# Patient Record
Sex: Female | Born: 1990 | Race: White | Hispanic: No | Marital: Single
Health system: Southern US, Community
[De-identification: ages and names within clinical notes are randomized; demographics above are authoritative.]

## PROBLEM LIST (undated history)

## (undated) DIAGNOSIS — K219 Gastro-esophageal reflux disease without esophagitis: Secondary | ICD-10-CM

## (undated) DIAGNOSIS — R110 Nausea: Secondary | ICD-10-CM

## (undated) DIAGNOSIS — R630 Anorexia: Secondary | ICD-10-CM

## (undated) DIAGNOSIS — R519 Headache, unspecified: Secondary | ICD-10-CM

## (undated) DIAGNOSIS — R109 Unspecified abdominal pain: Secondary | ICD-10-CM

## (undated) DIAGNOSIS — R195 Other fecal abnormalities: Secondary | ICD-10-CM

## (undated) DIAGNOSIS — F419 Anxiety disorder, unspecified: Secondary | ICD-10-CM

## (undated) HISTORY — PX: CHOLECYSTECTOMY: SHX55

## (undated) HISTORY — DX: Other fecal abnormalities: R19.5

## (undated) HISTORY — DX: Anorexia: R63.0

## (undated) HISTORY — PX: CYST REMOVAL: SHX22

## (undated) HISTORY — DX: Anxiety disorder, unspecified: F41.9

## (undated) HISTORY — PX: TONSILLECTOMY: SHX28A

## (undated) HISTORY — PX: COLONOSCOPY: SHX174

## (undated) HISTORY — DX: Unspecified abdominal pain: R10.9

## (undated) HISTORY — PX: UPPER GASTROINTESTINAL ENDOSCOPY: SHX188

## (undated) HISTORY — DX: Nausea: R11.0

## (undated) HISTORY — DX: Headache, unspecified: R51.9

---
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
## 2012-07-17 ENCOUNTER — Ambulatory Visit: Payer: Self-pay | Admitting: Orthopedic Surgery

## 2012-07-17 ENCOUNTER — Other Ambulatory Visit: Payer: Self-pay | Admitting: Orthopedic Surgery

## 2012-07-17 ENCOUNTER — Encounter: Payer: Self-pay | Admitting: Gastroenterology

## 2012-07-17 ENCOUNTER — Encounter: Payer: Self-pay | Admitting: Orthopedic Surgery

## 2012-07-17 VITALS — BP 114/62 | HR 56 | Ht 66.0 in | Wt 110.0 lb

## 2012-07-17 NOTE — Progress Notes (Signed)
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 She was at a concert, she jammed her finger/caught her finger in another person's clothing, she had pain, swelling, and deformity. She had x-rays at an urgent care which showed a fracture, she was placed in a splint which is uncomfortable and she removed, she put a new splint on herself. She has no paresthesias, no previous injuries, no other complaints.    PAST MEDICAL HISTORY: Negative    PAST SURGICAL HISTORY: Tonsillectomy, neck mass excision    MEDICATIONS:  See Medication List    ALLERGIES:  See Allergy List    PERSONAL HISTORY: Single    SOCIAL HISTORY: She does smoke and drink alcohol    FAMILY HISTORY: Family is healthy    REVIEW OF SYSTEMS:  Review of Gastointestinal, Genitourinary, Neurologic, Integument, Vascular, Hematologic, Lymphatic, Cardiac, Pulmonary and Endocrine systems reveal the following: The review of systems has been reviewed and is available on the scanned intake form. Positive responses are noted if pertinent.    PHYSICAL EXAM: The ring finger shows fusiform soft tissue swelling, ecchymosis, tenderness, and questionable slight deformity at the mid phalanx. Collateral ligaments grossly intact, limited range of motion at the PIP and DIP joint. No gross rotational deformity.    IMAGING: An oblique spiral fracture of the mid phalanx with mild shortening and displacement            ASSESSMENT AND DIAGNOSIS: Mildly displaced and shortened fracture of the mid phalanx.    PLAN: I reviewed his case with Dr. Shawna Clamp who examined the patient and discussed treatment options with the patient, together they decided that surgical intervention is most appropriate. Patient is placed in a volar/dorsal splint in intrinsic plus position, followup as per Dr. Roxy Manns office. She is taken out of work on  total disability.    ORDERS TODAY:    ORDERS PLACED FOR NEXT VISIT:    PERCENT OF TEMPORARY IMPAIRMENT:      The above document was generated using voice recognition software. Reasonable attempts at correction were made. Please excuse any unintended transcription errors.

## 2012-07-18 ENCOUNTER — Telehealth: Payer: Self-pay

## 2012-07-18 NOTE — H&P (Signed)
PATIENTDELMIRA, GRIFFITHS  MR #:  [id number]   ACCOUNT #:  [id number] DOB:  1990-11-10   ATTENDING:  Cam Hai, MD AGE:  22   DATE OF VISIT:  07/17/2012     PLEASE ALL SEE URGENT CARE CLINIC NOTE FOR THIS DATE BY Flonnie Hailstone.     The patient is seen today after a finger fracture.  She has an oblique spiral fracture of the P2 bone of the right ring finger.  It is her dominant hand.       We have discussed the nature of surgery, the risks and benefits, and the need to take out pins.  She is going to undergo surgery.  We are going to call her to begin the scheduling process.             ______________________________  Cam Hai, MD    JE/MODL  DD:  07/18/2012 07:50:06  DT:  07/18/2012 12:29:16  Job #:  [id number]    cc: Cam Hai, MD   Bevelyn Buckles, MD   [address]   [place], [place] [address]

## 2012-07-19 ENCOUNTER — Encounter: Payer: Self-pay | Admitting: Orthopedic Surgery

## 2012-07-19 NOTE — OR PreOp (Signed)
[facility] Pre-operative Guidelines    Day of surgery arrival Time:  The surgery center will call you the business day before your procedure between 2 and 4:30pm.     Eating and drinking guidelines:  No solid foods after midnight the night before your procedure.  Adults-  May have clear liquids (water, soda or apple juice only) up to 4 hours prior to your arrival time and then nothing by mouth.  Pediatrics-  All but dental patients may have clear liquids (water, soda, or apple juice only) up to 3 hours before arrival time.   Dental patients are NPO after midnight.  Breast fed infants may have breast milk up to 4 hours before arrival.  Formula fed infants may have formula up to 6 hours before arrival.    Medications:  Medications to be taken on the day of surgery:  none    Items to bring on the day of surgery:  Insurance card, photo ID and healthcare proxy.  If you use a CPAP for sleep apnea, please bring your mask with you.  Your crutches, knee brace or arm sling. Please leave your crutches in the car.    Clothing, Jewelry, valuables and eyeglass case:  Wear loose fitting clothing that will fit over a bulky dressing.  Leave all jewelry and valuables at home.  Bring your eyeglasses and eyeglass case. You cannot wear contact lenses.     Pediatrics:  A parent or legal guardian must accompany and remain in the building at all times. We recommend that two adults accompany the patient home while riding in a car; one to drive the vehicle and one to assist with the care of the child.   Bring the child’s favorite comfort item (i.e. blanket, doll or toy).    Surgeon Instructions:  Please read all pre-operative instructions carefully, and follow your surgeons guidelines if different from these instructions.

## 2012-07-19 NOTE — Anesthesia Pre-procedure Eval (Signed)
Anesthesia Pre-operative Evaluation for [facility] History  History reviewed. No pertinent past medical history.  Past Surgical History   Procedure Laterality Date   . Tonsillectomy     . Cyst removal       Social History  History   Substance Use Topics   . Smoking status: Current Every Day Smoker -- 1.00 packs/day for 4 years   . Smokeless tobacco: Never Used   . Alcohol Use: Yes      Comment: occasional      History   Drug Use No     ______________________________________________________________________  Allergies: No Known Allergies (drug, envir, food or latex)  Prior to Admission Medications              Last Dose Start Date End Date Provider     ibuprofen (ADVIL,MOTRIN) 600 MG tablet   --  --  [provider]        Current Outpatient Prescriptions   Medication Sig   . ibuprofen (ADVIL,MOTRIN) 600 MG tablet Take 600 mg by mouth 3 times daily as needed for Pain     No current facility-administered medications for this encounter.     Admission Medications:  Scheduled MedsIV MedsPRN Meds  Anesthesia EvaluationInformation Source: per patient, per family, per records  General  Pertinent (-):  history of anesthetic complications or FamHx of anesthetic complications        Pulmonary     + Smoker            currently  Pertinent (-):  asthma, pneumonia, recent URI or cough/congestion Cardiovascular  Denies cardiovascular issues    GI/Hepatic/Renal  Last PO Intake: >8hr before procedure    Pertinent (-):  GERD, liver  issues or renal issues Neuro/Psych  Pertinent (-):  headaches, seizures, cerebrovascular event or neuro deficit    Endo/Other  Pertinent (-):  diabetes mellitus    Hematologic  Pertinent (-):  bruises/bleeds easily or coagulopathy     Nursing Reported PO Status:           ______________________________________________________________________  Physical Exam    Airway            Mouth opening: normal            Mallampati: I            TM distance (fb): >3 FB            Neck ROM: full      Cardiovascular           Rhythm: regular           Rate: normal     Pulmonary     breath sounds clear to auscultation    Mental Status     oriented to person, place and time         Most Recent Vitals: Height: 167.6 cm (5\' 6" ) (07/19/12 1228)  Weight: 49.896 kg (110 lb) (07/19/12 1228)  BMI (Calculated): 17.8 (07/19/12 1228)    Vital Sign Ranges (last 24hrs)           Most Recent Lab Results   Blood Type  No results found for this basename: aborh, abs   CBC  Lab Results   Component Value Date    WBC 5.5 05/29/2010    HCT 39 05/29/2010    PLT 238 05/29/2010   Chem-7  Lab Results   Component Value Date    NA 139 08/04/2010    K 3.8  08/04/2010    CL 103 08/04/2010    CO2 24 08/04/2010    UN 12 08/04/2010    CREAT 0.70 08/04/2010    GLU 77 08/11/2010   Estimated Creatinine Clearance: 100.15 ml/min (based on Cr of 0.7).  Electrolytes  Lab Results   Component Value Date    CA 9.2 08/04/2010   Coags  No results found for this basename: PTI, INR, PTT   LFTs  No results found for this basename: AST, ALT, ALK      Pregnancy Status:   No LMP recorded.    Lab Results   Component Value Date    PUPT Negative-Dilute urine specimens may cause false negative urine pregnancy results... 07/20/2012     ECG Results  No results found for this basename: rate, PR, statement     ANES CPM    Radiology: Study Impressions from past 48hrs  No results found.    ________________________________________________________________________  Medical Problems  There are no active problems to display for this patient.      PreOp/PreProcedure Diagnosis (For more detail see procedural consent)            Finger fracture  Planned Procedure (For more detail see procedural consent)            Right finger proximal phalanx ORIF vs CRPP  Plan   ASA Score 1  Anesthetic Plan (general); Induction (routine IV); Airway (nasal cannula); Line ( use current access); Monitoring (standard ASA); Positioning (supine); Pain (per surgical team and intraop local); PostOp (PACU)    Informed  Consent     Risks:          Risks discussed were commensurate with the plan listed above with the following specific points:  N/V, aspiration, sore throat , damage to:(eyes, nerves, teeth), allergic Rx, unexpected serious injury, awareness    Anesthetic Consent:         Anesthetic plan and risks discussed with:  patient    Plan discussed with:  CRNA and surgeon      Attending Attestation: The patient or proxy understand and accept the risks and benefits of the anesthesia plan. By accepting this note, I attest that I have personally performed the history and physical exam and prescribed the anesthetic plan within 48 hours prior to the anesthetic as documented by me above.    Author: Stann Mainland, MD

## 2012-07-20 ENCOUNTER — Ambulatory Visit
Admit: 2012-07-20 | Discharge: 2012-07-20 | Disposition: A | Discharge status: Home / Self Care | Payer: Self-pay | Source: Ambulatory Visit | Attending: Orthopedic Surgery | Admitting: Orthopedic Surgery

## 2012-07-20 LAB — POCT URINE PREGNANCY
Exp date: 25
Lot #: 700547

## 2012-07-20 MED ORDER — CEFAZOLIN SODIUM 1000 MG IJ SOLR *I*
1000.0000 mg | INTRAMUSCULAR | Status: DC
Start: 2012-07-20 — End: 2012-07-21

## 2012-07-20 MED ORDER — DIPHENHYDRAMINE HCL 50 MG/ML IJ SOLN *I*
25.0000 mg | INTRAMUSCULAR | Status: DC | PRN
Start: 2012-07-20 — End: 2012-07-21

## 2012-07-20 MED ORDER — CEFAZOLIN SODIUM 1000 MG IJ SOLR *I*
INTRAMUSCULAR | Status: AC
Start: 2012-07-20 — End: 2012-07-20
  Filled 2012-07-20: qty 10

## 2012-07-20 MED ORDER — LIDOCAINE HCL 1 % IJ SOLN
0.1000 mL | Freq: Once | INTRAMUSCULAR | Status: DC | PRN
Start: 2012-07-20 — End: 2012-07-21
  Administered 2012-07-20: 0.1 mL via SUBCUTANEOUS

## 2012-07-20 MED ORDER — FENTANYL CITRATE 50 MCG/ML IJ SOLN *WRAPPED*
INTRAMUSCULAR | Status: AC
Start: 2012-07-20 — End: 2012-07-20
  Filled 2012-07-20: qty 2

## 2012-07-20 MED ORDER — HYDROCODONE-ACETAMINOPHEN 5-325 MG PO TABS *I*
1.0000 | ORAL_TABLET | ORAL | Status: DC | PRN
Start: 2012-07-20 — End: 2017-10-10

## 2012-07-20 MED ORDER — OXYCODONE-ACETAMINOPHEN 5-325 MG/5ML PO SOLN *A*
ORAL | Status: AC
Start: 2012-07-20 — End: 2012-07-20
  Filled 2012-07-20: qty 10

## 2012-07-20 MED ORDER — MEPERIDINE HCL 25 MG/ML IJ SOLN *I*
12.5000 mg | INTRAMUSCULAR | Status: DC | PRN
Start: 2012-07-20 — End: 2012-07-21

## 2012-07-20 MED ORDER — HALOPERIDOL LACTATE 5 MG/ML IJ SOLN *I*
1.0000 mg | Freq: Once | INTRAMUSCULAR | Status: AC | PRN
Start: 2012-07-20 — End: 2012-07-20

## 2012-07-20 MED ORDER — HYDROMORPHONE HCL PF 1 MG/ML IJ SOLN *WRAPPED*
0.5000 mg | INTRAMUSCULAR | Status: DC | PRN
Start: 2012-07-20 — End: 2012-07-21

## 2012-07-20 MED ORDER — LACTATED RINGERS IV SOLN *I*
20.0000 mL/h | INTRAVENOUS | Status: DC
Start: 2012-07-20 — End: 2012-07-21
  Administered 2012-07-20: 20 mL/h via INTRAVENOUS

## 2012-07-20 MED ORDER — OXYCODONE-ACETAMINOPHEN 5-325 MG/5ML PO SOLN *A*
5.0000 mL | Freq: Once | ORAL | Status: DC | PRN
Start: 2012-07-20 — End: 2012-07-21
  Administered 2012-07-20: 10 mL via ORAL

## 2012-07-20 MED ORDER — CEPHALEXIN 500 MG PO CAPS *I*
500.0000 mg | ORAL_CAPSULE | Freq: Three times a day (TID) | ORAL | Status: AC
Start: 2012-07-20 — End: 2012-07-23

## 2012-07-20 MED ORDER — DEXTROSE IN LACTATED RINGERS 5 % IV SOLN *I*
100.0000 mL/h | INTRAVENOUS | Status: DC
Start: 2012-07-20 — End: 2012-07-21

## 2012-07-20 NOTE — Anesthesia Pre-procedure Eval (Signed)
Anesthesia Pre-operative Evaluation for [facility] History  History reviewed. No pertinent past medical history.  Past Surgical History   Procedure Laterality Date   . Tonsillectomy     . Cyst removal       Social History  History   Substance Use Topics   . Smoking status: Current Every Day Smoker -- 1.00 packs/day for 4 years   . Smokeless tobacco: Never Used   . Alcohol Use: Yes      Comment: occasional      History   Drug Use No     ______________________________________________________________________  Allergies: No Known Allergies (drug, envir, food or latex)  Prior to Admission Medications              Last Dose Start Date End Date Provider     ibuprofen (ADVIL,MOTRIN) 600 MG tablet 07/19/2012 at Unknown time  --  --  [provider]        Current Outpatient Prescriptions   Medication Sig   . ibuprofen (ADVIL,MOTRIN) 600 MG tablet Take 600 mg by mouth 3 times daily as needed for Pain   . cephalexin (KEFLEX) 500 MG capsule Take 1 capsule (500 mg total) by mouth 3 times daily for 3 days   . HYDROcodone-acetaminophen (NORCO) 5-325 MG per tablet Take 1 tablet by mouth every 4-6 hours as needed for Pain   MDD 6 tablets     Current Facility-Administered Medications   Medication   . lidocaine 1 % injection 0.1 mL   . Lactated Ringers Infusion   . ceFAZolin (ANCEF) injection 1,000 mg     Admission Medications:  Scheduled MedsIV MedsPRN Meds  Anesthesia EvaluationInformation Source: per patient, per family, per records  General  Pertinent (-):  history of anesthetic complications or FamHx of anesthetic complications        Pulmonary     + Smoker            currently  Pertinent (-):  asthma, pneumonia, recent URI or cough/congestion Cardiovascular  Denies cardiovascular issues    GI/Hepatic/Renal  Last PO Intake: >8hr before procedure    Pertinent (-):  GERD, liver  issues or renal issues Neuro/Psych  Pertinent (-):  headaches, seizures, cerebrovascular event or neuro  deficit    Endo/Other  Pertinent (-):  diabetes mellitus    Hematologic  Pertinent (-):  bruises/bleeds easily or coagulopathy     Nursing Reported PO Status: Date Last PO Fluids: 07/20/12 0530  Date Last PO Solids: 07/19/12 2359  ______________________________________________________________________  Physical Exam    Airway            Mouth opening: normal            Mallampati: I            TM distance (fb): >3 FB            Neck ROM: full     Cardiovascular           Rhythm: regular           Rate: normal     Pulmonary     breath sounds clear to auscultation    Mental Status     oriented to person, place and time         Most Recent Vitals: BP: 104/65 mmHg (07/20/12 0727)  Heart Rate: 78 (07/20/12 0727)  Temp: 36.6 C (97.9 F) (07/20/12 0727)  Resp: 18 (07/20/12 0727)  Height: 167.6 cm (5\' 6" ) (  07/20/12 0727)  Weight: 49.986 kg (110 lb 3.2 oz) (07/20/12 0727)  BMI (Calculated): 17.8 (07/20/12 0727)  SpO2: 96 % (07/20/12 0727)    Vital Sign Ranges (last 24hrs)  Temp:  [36.6 C (97.9 F)] 36.6 C (97.9 F)  Heart Rate:  [78] 78  Resp:  [18] 18  BP: (104)/(65) 104/65 mmHg   O2 Device: None (Room air) (07/20/12 0729)    Most Recent Lab Results   Blood Type  No results found for this basename: aborh,  abs   CBC  Lab Results   Component Value Date    WBC 5.5 05/29/2010    HCT 39 05/29/2010    PLT 238 05/29/2010   Chem-7  Lab Results   Component Value Date    NA 139 08/04/2010    K 3.8 08/04/2010    CL 103 08/04/2010    CO2 24 08/04/2010    UN 12 08/04/2010    CREAT 0.70 08/04/2010    GLU 77 08/11/2010   Estimated Creatinine Clearance: 100.35 ml/min (based on Cr of 0.7).  Electrolytes  Lab Results   Component Value Date    CA 9.2 08/04/2010   Coags  No results found for this basename: PTI,  INR,  PTT   LFTs  No results found for this basename: AST,  ALT,  ALK      Pregnancy Status: Postmenarcheal [5]  No LMP recorded.    Lab Results   Component Value Date    PUPT Negative-Dilute urine specimens may cause false negative urine pregnancy  results... 07/20/2012     ECG Results  No results found for this basename: rate,  PR,  statement     ANES CPM    Radiology: Study Impressions from past 48hrs  No results found.    ________________________________________________________________________  Medical Problems  There are no active problems to display for this patient.      PreOp/PreProcedure Diagnosis (For more detail see procedural consent)            Finger fracture  Planned Procedure (For more detail see procedural consent)            Right finger proximal phalanx ORIF vs CRPP  Plan   ASA Score 1  Anesthetic Plan (general); Induction (routine IV); Airway (nasal cannula); Line ( use current access); Monitoring (standard ASA); Positioning (supine); Pain (per surgical team and intraop local); PostOp (PACU)    Informed Consent     Risks:          Risks discussed were commensurate with the plan listed above with the following specific points:  N/V, aspiration, sore throat , damage to:(eyes, nerves, teeth), allergic Rx, unexpected serious injury, awareness    Anesthetic Consent:         Anesthetic plan and risks discussed with:  patient    Plan discussed with:  surgeon and attending      PEC/PreOp Attestation: Anesthesia options were discussed with the patient or proxy and they understand the risks and benefits of the various anesthetic options.    Author: Jesse Sans, CRNA

## 2012-07-20 NOTE — H&P (Signed)
UPDATES TO PATIENT'S CONDITION on the DAY OF SURGERY/PROCEDURE    I. Updates to Patient's Condition (to be completed by a provider privileged to complete a H&P, following reassessment of the patient by the provider):    Day of Surgery/Procedure Update:  History  History reviewed and no change    Physical  Physical exam updated and no change              II. Procedure Readiness   I have reviewed the patient's H&P and updated condition. By completing and signing this form, I attest that this patient is ready for surgery/procedure.      III. Attestation   I have reviewed the updated information regarding the patient's condition and it is appropriate to proceed with the planned surgery/procedure.    Cleofas Hudgins, MD

## 2012-07-20 NOTE — INTERIM OP NOTE (Signed)
Interim Op Note    Date of Surgery: 07/20/2012   Surgeon: Cam Hai, MD   Co-Surgeon: none   First Assistant: Senaida Ores, MD  Second Assistant: Marlene Bast, MD    Pre-Op Diagnosis: Right Ring Finger Middle Phalanx Fx    Anesthesia Type: General    Post-Op Diagnosis:    Primary: Same      Additional Findings (Including unexpected complications): none    Procedure(s) Performed (including CPT 4 Code if available)  CRPP R Ring Finger Middle Phalanx    Estimated Blood Loss: minimal   Packing: No  Drains: No  Fluid Totals: Intakes: See anesthesia record  Outputs: none  Specimens to Pathology: no  Patient Condition: good    Valda Lamb, MD  9:41 AM  07/20/2012

## 2012-07-20 NOTE — Op Note (Signed)
Darlene Murphy, Darlene Murphy  MR #:  [id number]   ACCOUNT #:  [id number] DOB:  December 05, 1990    AGE:  22     SURGEON:  Cam Hai, MD  CO-SURGEON:    ASSISTANT:  Dr. Jewel Baize  SURGERY DATE:  07/20/2012    PREOPERATIVE DIAGNOSIS:  Right ring finger middle phalanx fracture.    POSTOPERATIVE DIAGNOSIS:  Right ring finger middle phalanx fracture.    OPERATIVE PROCEDURE:  Right ring finger, middle phalanx, closed reduction and percutaneous pinning.    ANESTHESIA:  MAC with local.    INDICATIONS FOR PROCEDURE:  This is a 22 year old female who presented to clinic after an injury that she sustained to her right ring finger.  The patient had initially sought treatment from our urgent care clinic.  X-rays were taken at that point, which revealed a spiral oblique fracture of her middle phalanx of her right ring finger.  Therefore the risks and benefits of fixation of this fracture were explained to the patient.  She agreed to proceed.    DESCRIPTION OF PROCEDURE:  The patient was correctly identified in the preoperative holding area.  The correct extremity was marked.  Patient was taken to the operating room, laid supine on the operating table.  A tourniquet was applied to the patient's right upper arm.  Antibiotics were given.  The patient was then sterilely prepped and draped in normal fashion.  A time-out was held, verifying the correct side, site and patient.  Once this was complete, mini C fluoroscopy was then used to visualize the fracture itself.  This was then reduced with a combination of a towel clamp and reduction maneuver.  It was then visualized again and found to be well reduced.  At this point 0.045 K-wires were used from the ulnar to radial direction to hold the fracture in place.  This was then visualized under mini C-fluoroscopy.  The reduction appeared inadequate.  Therefore these pins were removed, and a single 0.054 K-wire was placed from the tip of the finger through the DIP and through the fracture  fragments while holding the reduction manually.  This was then visualized once again and adequate reduction was obtained both on AP and lateral views of the finger.  A single 0.035 K-wire was then placed from ulnar to radial to add to the rotational stabilization of the fracture.  This was then visualized again under mini C fluoroscopy in orthogonal views and found to be adequate.  The pins were then cut underneath the skin.  Sterile dressing was applied.  The patient was placed in an ulnar gutter splint.  The patient was awakened from anesthesia and taken to postanesthesia care unit in good condition.    POSTOPERATIVE PLAN:  The patient should keep her dressing clean and dry.  She should call the office with any questions or concerns and take her pain medicine as prescribed.       Dictated By:  Marisa Cyphers, MD      ______________________________  Cam Hai, MD    WLR/MODL  DD:  07/20/2012 11:07:13  DT:  07/20/2012 16:06:44  Job #:  [id number]    cc:

## 2012-07-20 NOTE — Anesthesia Post-procedure Eval (Signed)
Anesthesia Post-op Note    Patient: Darlene Murphy    Procedure(s) Performed: CRPP R Ring Finger Middle Phalanx      Anesthesia type: General- tiva    Patient location: PACU    Mental Status: Recovered to baseline    Patient able to participate in this evaluation: yes  Last Vitals: BP: 111/65 mmHg (07/20/12 1015)  Heart Rate: 72 (07/20/12 1015)  Temp: 36.3 C (97.3 F) (07/20/12 1610)  Resp: 16 (07/20/12 1015)  Height: 167.6 cm (5\' 6" ) (07/20/12 0727)  Weight: 49.986 kg (110 lb 3.2 oz) (07/20/12 0727)  BMI (Calculated): 17.8 (07/20/12 0727)  SpO2: 100 % (07/20/12 1015)      Post-op vital signs noted above are within patient's normal range  Post-op vitals signs: stable  Respiratory function: baseline    Airway patent: Yes    Cardiovascular and hydration status stable: Yes    Post-Op pain: Adequate analgesia    Post-Op nausea and vomiting: none    Post-Op assessment: no apparent anesthetic complications, tolerated procedure well and no evidence of recall    Complications: none    Attending Attestation: All indicated post anesthesia care provided    Author: Stann Mainland, MD  as of: 07/20/2012  at: 10:29 AM

## 2012-07-20 NOTE — Discharge Instructions (Signed)
It is our recommendation that you have someone stay with you for 24 hours following your procedure.Due to the sedation medication and or general anesthesia you have been given today, please follow these discharge instructions:    [x]  Do not drive or operate any machinery for 24 hours or the specified time frame that was recommended by your doctor, please refer to post op instructions.  [x]  Do not drink any alcoholic beverages for 24 hours after your procedure and/or if you are taking a narcotic pain reliever (e.g. Vicodin, Percocet or Tylenol #3).  [x]  Do not make any major decisions or sign contracts for 24 hours.  [x]  Prescription information given to patient and/or patient representative.    Diet: begin with liquids, advance as tolerated.      Your next dose of pain medication is due when    You may alternate ibuprofen with your pain medicine.    Toradol( like advil /motrin/ibuprofen) given@     Ibuprofen due @      If unable to reach your doctor call 911 for a true emergency or go to your nearest emergency department.    It is our recommendation that you have someone stay with you for 24 hours following your procedure.      Diet: Advance diet as tolerated today.    Activities:  For the first 2-3 days rest and keep hand elevated above the heart with ice on the hand/elbow.  Use your pillows.  Move as tolerated, the uninvolved areas of your fingers, elbow, and shoulder so they do not get stiff.        Wound Care:  Keep and clean and dry and DO NOT remove dressing unless directed  otherwise.  If the bandage feels too tight you may unwrap the Ace bandage and reapply it more loosely.      Special Instructions:  Continue to use ice for 1-2 weeks as needed to ease the pain. Do not apply ice directly to the skin.    Pain Management/Medications:  You will be given a prescription for a narcotic pain medication. You may take Ibuprofen 600mg  (Advil, Motrin, store brand Ibuprofen) every 6 hours with food  Be sure to take  your pain medications with food to avoid aggravating your stomach. Over the counter stool softeners only as needed to prevent constipation. We recommend increasing your fluid intake or eating high fiber to help prevent constipation.      Follow up:  Your post operative appointment should have been set up at the time your surgery was scheduled.  If not, please call our secretary at [phone] to schedule an appointment for you.

## 2012-07-21 MED FILL — Lidocaine HCl Local Inj 2%: INTRAMUSCULAR | Qty: 20 | Status: AC

## 2012-07-21 MED FILL — Bupivacaine HCl Preservative Free (PF) Inj 0.5%: INTRAMUSCULAR | Qty: 30 | Status: AC

## 2012-07-21 MED FILL — Bacitracin-Polymyxin B Oint: CUTANEOUS | Qty: 1 | Status: AC

## 2012-08-01 ENCOUNTER — Other Ambulatory Visit: Payer: Self-pay | Admitting: Orthopedic Surgery

## 2012-08-01 ENCOUNTER — Ambulatory Visit: Payer: Self-pay | Admitting: Orthopedic Surgery

## 2012-08-01 ENCOUNTER — Encounter: Payer: Self-pay | Admitting: Orthopedic Surgery

## 2012-08-01 VITALS — BP 114/73 | Ht 66.0 in | Wt 110.0 lb

## 2012-08-01 DIAGNOSIS — Z09 Encounter for follow-up examination after completed treatment for conditions other than malignant neoplasm: Secondary | ICD-10-CM

## 2012-08-01 NOTE — Progress Notes (Signed)
This office note has been dictated.

## 2012-08-03 NOTE — Progress Notes (Signed)
PATIENTSHAELAH, Darlene Murphy  MR #:  [id number]   ACCOUNT #:  [id number] DOB:  07/04/1990   DICTATED BY:  Cam Hai, MD DATE OF VISIT:  08/01/2012     Patient is seen today status post pinning of her complex right ring finger middle phalanx fracture on 07/20/12.  Doing well.       X-RAYS today are reviewed personally by me and they look very appropriate and show acceptable alignment.        PHYSICAL EXAMINATION:  Range of motion of the ring finger PIP joint is adequate passively, although she hasn't really done that in a while.       ASSESSMENT/PLAN: Patient is doing well. She is going to follow up with me in about THREE TO FOUR WEEKS.  I AM ORDERING REPEAT X-RAYS OF HER RIGHT RING FINGER TO BE TAKEN.       She is going to be out of any physically-oriented labor with that hand; she can do desk work only.  If there is no desk work available for her,then she will have to be out on full disability.             ______________________________  Cam Hai, MD    JE/MODL  DD:  08/02/2012 09:10:28  DT:  08/03/2012 11:41:10  Job #:  [id number]    cc: Cam Hai, MD   Hollice Gong, MD   [address] [place]   [address]   [place], [place] [address]

## 2012-08-15 ENCOUNTER — Telehealth: Payer: Self-pay

## 2012-08-18 ENCOUNTER — Encounter: Payer: Self-pay | Admitting: Orthopedic Surgery

## 2012-08-18 ENCOUNTER — Ambulatory Visit: Payer: Self-pay | Admitting: Orthopedic Surgery

## 2012-08-18 NOTE — Progress Notes (Signed)
The patient presents today for an unscheduled appointment.  She had a right ring finger middle phalanx close reduction and percutaneous pinning on 07/20/12.  She states that she notice earlier this week that there was pus coming out of the pin site.  She states that she put peroxide and Neosporin on the wound and it is actually improved.  She is also noting a prominent area in her distal second metacarpal area.  The patient has not yet returned to work, she works at the [employer].    Exam: Right ring finger: Wound is benign.  No pus or foul odor coming from the wound.  No redness noted.  She is able to demonstrate good motion her uninvolved joints.  Finger appears warm and well perfused.  No evidence of any streaking or spreading infection.    Assessment: Right ring finger middle phalanx close reduction and percutaneous pinning.    Plan: The patient will followup for her next scheduled appointment on July 1. At her next visit she'll of x-rays taken prior to being seen.

## 2012-08-23 ENCOUNTER — Other Ambulatory Visit: Payer: Self-pay | Admitting: Orthopedic Surgery

## 2012-08-23 DIAGNOSIS — Z09 Encounter for follow-up examination after completed treatment for conditions other than malignant neoplasm: Secondary | ICD-10-CM

## 2012-08-29 ENCOUNTER — Encounter: Payer: Self-pay | Admitting: Orthopedic Surgery

## 2012-08-29 ENCOUNTER — Ambulatory Visit: Payer: Self-pay | Admitting: Orthopedic Surgery

## 2012-08-29 VITALS — BP 115/64 | Ht 66.0 in | Wt 110.0 lb

## 2012-08-29 DIAGNOSIS — Z09 Encounter for follow-up examination after completed treatment for conditions other than malignant neoplasm: Secondary | ICD-10-CM

## 2012-08-29 NOTE — Progress Notes (Signed)
This office note has been dictated.

## 2012-08-31 ENCOUNTER — Other Ambulatory Visit: Payer: Self-pay | Admitting: Orthopedic Surgery

## 2012-08-31 MED ORDER — CEPHALEXIN 500 MG PO CAPS *I*
500.0000 mg | ORAL_CAPSULE | Freq: Three times a day (TID) | ORAL | Status: AC
Start: 2012-08-31 — End: 2012-09-03

## 2012-08-31 MED ORDER — HYDROCODONE-ACETAMINOPHEN 5-325 MG PO TABS *I*
1.0000 | ORAL_TABLET | ORAL | Status: DC | PRN
Start: 2012-08-31 — End: 2017-10-10

## 2012-08-31 NOTE — Progress Notes (Signed)
PATIENTDIAMONE, KARRAKER  MR #:  [id number]   ACCOUNT #:  [id number] DOB:  [birth year]   DICTATED BY:  Cam Hai, MD DATE OF VISIT:  08/29/2012     Patient is seen today now status post pinning of her middle phalanx fracture.  Doing well.       PHYSICAL EXAMINATION:  She actually has full range of motion of the PIP joint.  The pins are in place.     X-RAYS confirm healing.     ASSESSMENT/PLAN: Patient has had her pins in for just over a month.  We are going to schedule removal of her pins under local plus sedation anesthesia at a surgery center. We will see her back then.             ______________________________  Cam Hai, MD    JE/MODL  DD:  08/30/2012 14:47:07  DT:  08/31/2012 15:33:41  Job #:  [id number]    cc: Cam Hai, MD   Hollice Gong, MD   [address] [place]   [address]   [place], [place] [address]

## 2012-09-12 ENCOUNTER — Ambulatory Visit: Payer: Self-pay | Admitting: Orthopedic Surgery

## 2012-09-12 ENCOUNTER — Ambulatory Visit: Payer: Self-pay | Admitting: Rehabilitative and Restorative Service Providers"

## 2012-09-12 ENCOUNTER — Encounter: Payer: Self-pay | Admitting: Orthopedic Surgery

## 2012-09-12 VITALS — BP 112/63 | Ht 66.0 in | Wt 110.0 lb

## 2012-09-12 DIAGNOSIS — Z09 Encounter for follow-up examination after completed treatment for conditions other than malignant neoplasm: Secondary | ICD-10-CM

## 2012-09-12 DIAGNOSIS — S62629A Displaced fracture of medial phalanx of unspecified finger, initial encounter for closed fracture: Secondary | ICD-10-CM

## 2012-09-12 NOTE — Progress Notes (Signed)
The patient presents today status post pin removal for a right ring finger middle phalanx close reduction pertains pinning on 07/20/12.  She is doing well.  The patient's pain has been well-controlled.  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    Exam: Right ring finger: Wounds are benign.  Sutures intact, removed today.  No evidence of any infection at the wound sites.  Finger is minimally swollen.  Warm and well perfused.    Assessment:  Right ring finger middle phalanx close reduction and percutaneous pinning.     Plan: The patient will go to therapy to begin working on finger range of motion.  She will followup in about 4-5 weeks for reassessment.

## 2012-09-13 NOTE — Progress Notes (Signed)
[facility]  OT Initial Evaluation    History  Encounter Diagnosis   Name Primary?   . Fracture of middle phalanx of finger Yes     Cam Hai, MD  [address], Darlene Murphy  [name], [place] [address]    Onset date: 5/14  Date of Surgery: 07/20/12    Subjective    Darlene Murphy is a 22 y.o. female who is present today for right, finger pain.  Mechanism of injury/history of symptoms: Trauma    Pt has received care for 0 visits to date.    Occupation and Activities  Work status: Off work  Job title/type of work: Designer, industrial/product  Stresses/physical demands of job: Fine Motor Manipulation  Stresses/physical demands of home: Self Care and Hobbies  Diagnostic tests: Per report, reviewed        Symptom location: Posterior and Anterior, right  Relevant symptoms: Throbbing, Decreased ROM, Mechanical symptoms  Symptom frequency: Intermittent  Symptom intensity (0 - 10 scale): Now 0 Best 0 Worst 2     Night Pain: No        Morning Pain/Stiffness: Deferred at this time   Patient's goals for therapy: Increase ROM     Objective:    Observation: Unremarkable  Sensibility Screen: intact  Palpation: Swelling, Tenderness slight mid to distal end of digit  Scar:  Unremarkable      ROM/Strength (unoted implies WFL or NA Manual Muscle Testing Scores_/5 )  UE      AROM AROM PROM PROM MMT MMT    Right Left Right Left Right Left   Shoulder          Forward Flexion         Extension         Abduction          Internal Rotation         External Rotation                            AROM PROM     Right Left Right Left   THUMB MP Extension/Flexion        IP Extension/Flexion        Radial Abduction        Palmar Abduction       INDEX MCP Extension/Flexion        PIP Extension/Flexion        DIP Extension/Flexion       LONG MCP Extension/Flexion        PIP Extension/Flexion        DIP Extension/Flexion       RING MCP Extension/Flexion 0/80       PIP Extension/Flexion 15/98  0/     DIP Extension/Flexion  10/38  0/45    LITTLE MCP Extension/Flexion        PIP Extension/Flexion        DIP Extension/Flexion           Hand Strength: Jamar dynamometer (kg)    Position Right Left   1     2 nt    3     4     5          Hand dominance:  right    tx included pt education, hep for rom, prom, mh.     Assessment:  Findings consistent with 22 y.o., female with   Encounter Diagnosis   Name Primary?   Marland Kitchen  Fracture of middle phalanx of finger Yes     with swelling, ROM limitations, functional limitations    Prognosis:  Good   Contraindications/Precautions/Limitation:  Per diagnosis  Short Term Goals:  Minimal assistance with HEP/ education concepts  Long Term Goals:  ROM/ flexibility WNL , Functional return to ADLs / activites without limitation      Treatment Plan:     Patient/family involved in developing goals and treatment plan: Yes  Expected length of care 4 week(s)  Freq 1 times per week for 4 week    Treatment plan inclusive of:   Exercise: AROM, AAROM, PROM   Manual Techniques:  Joint mobilization   Modalities:  Moist heat, Ther Exercise per flowsheet        Request additional:  4 visits for therapy.     Thank you for the referral of this patient to [facility]. Please do not hesitate to contact me if I may be of assistance.      Please submit written approval or fax [phone]    [facility]

## 2012-09-18 ENCOUNTER — Ambulatory Visit: Payer: Self-pay | Admitting: Rehabilitative and Restorative Service Providers"

## 2012-10-10 ENCOUNTER — Ambulatory Visit: Payer: Self-pay | Admitting: Orthopedic Surgery

## 2012-10-10 ENCOUNTER — Encounter: Payer: Self-pay | Admitting: Orthopedic Surgery

## 2012-10-10 VITALS — BP 99/57 | Ht 66.0 in | Wt 110.0 lb

## 2012-10-10 DIAGNOSIS — Z09 Encounter for follow-up examination after completed treatment for conditions other than malignant neoplasm: Secondary | ICD-10-CM

## 2012-10-10 NOTE — Progress Notes (Signed)
The patient presents today for followup of a hardware removal for a right ring finger middle phalanx fracture on 07/20/12.  She is doing well.  Her pain has been well-controlled.  The patient has returned to light duty work.    Exam:  Right ring finger:  Wounds are benign.  The patient is able to demonstrate excellent finger ROM.  Able to make a fist.  All fingers are warm and well perfused.    Assessment/Plan:  The patient will return to work with a 20 pound weight restriction, as of September 1 she'll return unrestricted.  The patient will follow up prn.

## 2013-05-16 ENCOUNTER — Ambulatory Visit
Admit: 2013-05-16 | Discharge: 2013-05-16 | Disposition: A | Discharge status: Home / Self Care | Payer: Self-pay | Source: Ambulatory Visit | Attending: Internal Medicine | Admitting: Internal Medicine

## 2013-05-17 LAB — N. GONORRHOEAE DNA AMPLIFICATION: N. gonorrhoeae DNA Amplification: 0

## 2013-05-17 LAB — CHLAMYDIA PLASMID DNA AMPLIFICATION: Chlamydia Plasmid DNA Amplification: 0

## 2015-02-10 ENCOUNTER — Ambulatory Visit
Admission: RE | Admit: 2015-02-10 | Discharge: 2015-02-10 | Disposition: A | Discharge status: Home / Self Care | Payer: Self-pay | Source: Ambulatory Visit | Attending: Internal Medicine | Admitting: Internal Medicine

## 2015-02-10 LAB — URINALYSIS WITH REFLEX TO MICROSCOPIC
Blood,UA: NEGATIVE
Ketones, UA: NEGATIVE
Leuk Esterase,UA: NEGATIVE
Nitrite,UA: NEGATIVE
Protein,UA: NEGATIVE mg/dL
Specific Gravity,UA: 1.021 (ref 1.002–1.030)
pH,UA: 5 (ref 5.0–8.0)

## 2015-02-10 LAB — COMPREHENSIVE METABOLIC PANEL
ALT: 12 U/L (ref 0–35)
AST: 17 U/L (ref 0–35)
Albumin: 4.6 g/dL (ref 3.5–5.2)
Alk Phos: 36 U/L (ref 35–105)
Anion Gap: 15 (ref 7–16)
Bilirubin,Total: 0.5 mg/dL (ref 0.0–1.2)
CO2: 23 mmol/L (ref 20–28)
Calcium: 8.5 mg/dL — ABNORMAL LOW (ref 9.0–10.4)
Chloride: 102 mmol/L (ref 96–108)
Creatinine: 0.77 mg/dL (ref 0.51–0.95)
GFR,Black: 125 *
GFR,Caucasian: 109 *
Glucose: 78 mg/dL (ref 60–99)
Lab: 13 mg/dL (ref 6–20)
Potassium: 4 mmol/L (ref 3.3–5.1)
Sodium: 140 mmol/L (ref 133–145)
Total Protein: 7.1 g/dL (ref 6.3–7.7)

## 2015-02-10 LAB — LIPID PANEL
Chol/HDL Ratio: 1.9
Cholesterol: 158 mg/dL
HDL: 82 mg/dL
LDL Calculated: 65 mg/dL
Non HDL Cholesterol: 76 mg/dL
Triglycerides: 57 mg/dL

## 2015-02-10 LAB — VITAMIN B12: Vitamin B12: 362 pg/mL (ref 211–946)

## 2015-02-10 LAB — TSH: TSH: 2.3 u[IU]/mL (ref 0.27–4.20)

## 2015-02-11 LAB — TISSUE TRANSGLUT,IGA
IgA: 121 mg/dL (ref 70–400)
tTG,IgA: 3.1 AB/Units (ref 0.0–19.9)

## 2015-09-28 ENCOUNTER — Ambulatory Visit
Admission: AD | Admit: 2015-09-28 | Discharge: 2015-09-28 | Disposition: A | Discharge status: Home / Self Care | Payer: Self-pay

## 2015-09-28 DIAGNOSIS — R21 Rash and other nonspecific skin eruption: Secondary | ICD-10-CM

## 2015-09-28 MED ORDER — PREDNISONE 20 MG PO TABS *I*
60.0000 mg | ORAL_TABLET | Freq: Every day | ORAL | 0 refills | Status: DC
Start: 2015-09-28 — End: 2017-10-10

## 2015-09-28 NOTE — UC Provider Note (Signed)
History     Chief Complaint   Patient presents with    Rash     1 day patient says she started with a rash, (small bumps) all over her body that comes and goes, areas are dry and itchy, she completed antibiotics 2 days ago     Patient is a 25 y.o. female presenting with rash.   History provided by:  Patient  Language interpreter used: No    Is this ED visit related to civilian activity for income:  Not work related  Rash   Location:  Full body  Quality: itchiness, redness and swelling    Severity:  Moderate  Onset quality:  Sudden  Duration:  2 days  Timing:  Constant  Progression:  Waxing and waning  Chronicity:  New  Context comment:  Onset of rash the day after she finished a 5 day course of Zithromax for treatment of strep throat  Relieved by:  Nothing  Worsened by:  Contact  Ineffective treatments: Advil.  Associated symptoms: no abdominal pain, no diarrhea, no fatigue, no fever, no headaches, no hoarse voice, no induration, no joint pain, no myalgias, no nausea, no periorbital edema, no shortness of breath, no sore throat, no throat swelling, no tongue swelling, no URI, not vomiting and not wheezing        History reviewed. No pertinent past medical history.         Past Surgical History:   Procedure Laterality Date    CYST REMOVAL      TONSILLECTOMY         History reviewed. No pertinent family history.      Social History    reports that she has been smoking Cigarettes.  She has a 4.00 pack-year smoking history. She has never used smokeless tobacco. She reports that she drinks alcohol. She reports that she does not use illicit drugs. Her sexual activity history is not on file.    Living Situation     Questions Responses    Patient lives with     Homeless     Caregiver for other family member     External Services     Employment     Domestic Violence Risk           Review of Systems   Review of Systems   Constitutional: Negative for fatigue and fever.   HENT: Negative for hoarse voice and sore throat.     Respiratory: Negative for shortness of breath and wheezing.    Gastrointestinal: Negative for abdominal pain, diarrhea, nausea and vomiting.   Musculoskeletal: Negative for arthralgias and myalgias.   Skin: Positive for rash.   Neurological: Negative for headaches.   All other systems reviewed and are negative.      Physical Exam     ED Triage Vitals   BP Heart Rate Heart Rate (via Pulse Ox) Resp Temp Temp src SpO2 O2 Device O2 Flow Rate   09/28/15 1646 09/28/15 1646 -- 09/28/15 1646 09/28/15 1646 09/28/15 1646 09/28/15 1646 09/28/15 1646 --   112/70 83  16 36.4 C (97.5 F) Oral 98 % None (Room air)       Weight           --                               Physical Exam   Constitutional: She is oriented to person, place, and time. She appears  well-developed and well-nourished.   HENT:   Head: Normocephalic and atraumatic.   Mouth/Throat: Oropharynx is clear and moist.   Neck: Normal range of motion. Neck supple.   Cardiovascular: Normal rate, regular rhythm, normal heart sounds and intact distal pulses.  Exam reveals no gallop and no friction rub.    No murmur heard.  Pulmonary/Chest: Effort normal and breath sounds normal. No respiratory distress. She has no wheezes. She has no rales.   Lymphadenopathy:     She has no cervical adenopathy.   Neurological: She is alert and oriented to person, place, and time.   Skin: Skin is warm and dry. Rash noted.   Erythematous papular rash noted to the face and body.   Psychiatric: She has a normal mood and affect. Her behavior is normal. Judgment and thought content normal.   Nursing note and vitals reviewed.       Medical Decision Making        Initial Evaluation:  ED First Provider Contact     Date/Time Event User Comments    09/28/15 1651 ED Provider First Contact Alysia Penna M Initial Face to Face Provider Contact          Patient was seen on: 09/28/2015        Assessment:  25 y.o.female comes to the Urgent Care Center with a rash that started 1 day after completing a 5 day  course of Zithromax for treatment of strep throat.    Differential Diagnosis includes Contact Dermatitis, Seborrheic dermatitis, Hives/Urticaria, Eczema, Psoriasis, Viral exanthem, Folliculitis, Tinea corporis, Skin infection, Cellulitis, Abscess.                  Plan:   Orders Placed This Encounter    predniSONE (DELTASONE) 20 MG tablet       Final Diagnosis    ICD-10-CM ICD-9-CM   1. Rash and nonspecific skin eruption R21 782.1       Encourage fluids, encourage rest, good hand hygiene.    Use over the counter medications as discussed. Use over the counter medications as discussed. Recommend a non drowsy antihistamine such as Allegra, Zyrtec or Claritin for daytime use prn itching. Recommend otc Benadryl as needed for nighttime itching.     Please start the new medications as below:    Current Discharge Medication List      New Medications    Details Last Dose Given Next Dose Due Script Given?   predniSONE (DELTASONE) 60 mg Dose: 60 mg  Take 60 mg by mouth daily   For 2 days then 2 tabs PO daily x 2 days then 1 tab PO daily x 2 days  Quantity 12 tablet, Refill 0  Start date: 09/28/2015               Continued (CHANGED)    Details Last Dose Given Next Dose Due Script Given?   ibuprofen (ADVIL,MOTRIN) 600 mg Dose: 600 mg  Take 600 mg by mouth 3 times daily as needed for Pain                     Please follow up with your physician as below:    Follow-up Information     Follow up with Plain, Greggory Stallion, MD In 2 days.    Specialty:  Internal Medicine    Why:  As needed, If symptoms do not improve    Contact information:    [address]  [address]  [place] [address]  [phone]          If short of breath, chest pains or any other concerns please report to the emergency room.    In the event of an Emergency dial 911.     Final Diagnosis  Final diagnoses:   [R21] Rash and nonspecific skin eruption (Primary)           Concha Pyo, PA    Supervising physician Dr. Dorna Bloom was immediately available     Concha Pyo, [place]  09/28/15 1700

## 2015-09-28 NOTE — Discharge Instructions (Signed)
Use over the counter medications as discussed. Recommend a non drowsy antihistamine such as Allegra, Zyrtec or Claritin for daytime use prn itching. Recommend otc Benadryl as needed for nighttime itching.

## 2016-05-13 ENCOUNTER — Other Ambulatory Visit
Admission: RE | Admit: 2016-05-13 | Discharge: 2016-05-13 | Disposition: A | Discharge status: Home / Self Care | Payer: Self-pay | Source: Ambulatory Visit | Attending: Internal Medicine | Admitting: Internal Medicine

## 2016-05-13 DIAGNOSIS — J029 Acute pharyngitis, unspecified: Secondary | ICD-10-CM | POA: Insufficient documentation

## 2016-05-15 LAB — STREP A CULTURE, THROAT: Group A Strep Throat Culture: 0

## 2017-01-24 ENCOUNTER — Other Ambulatory Visit
Admission: RE | Admit: 2017-01-24 | Discharge: 2017-01-24 | Disposition: A | Discharge status: Home / Self Care | Payer: Commercial Managed Care - PPO | Source: Ambulatory Visit | Attending: Internal Medicine | Admitting: Internal Medicine

## 2017-01-24 DIAGNOSIS — Z0189 Encounter for other specified special examinations: Secondary | ICD-10-CM | POA: Insufficient documentation

## 2017-01-24 LAB — COMPREHENSIVE METABOLIC PANEL
ALT: 11 U/L (ref 0–35)
AST: 15 U/L (ref 0–35)
Albumin: 4.5 g/dL (ref 3.5–5.2)
Alk Phos: 42 U/L (ref 35–105)
Anion Gap: 12 (ref 7–16)
Bilirubin,Total: 0.4 mg/dL (ref 0.0–1.2)
CO2: 25 mmol/L (ref 20–28)
Calcium: 9 mg/dL (ref 8.8–10.2)
Chloride: 102 mmol/L (ref 96–108)
Creatinine: 0.82 mg/dL (ref 0.51–0.95)
GFR,Black: 114 *
GFR,Caucasian: 99 *
Glucose: 74 mg/dL (ref 60–99)
Lab: 15 mg/dL (ref 6–20)
Potassium: 4.1 mmol/L (ref 3.3–5.1)
Sodium: 139 mmol/L (ref 133–145)
Total Protein: 6.7 g/dL (ref 6.3–7.7)

## 2017-01-24 LAB — CBC
Hematocrit: 39 % (ref 34–45)
Hemoglobin: 12.8 g/dL (ref 11.2–15.7)
MCH: 31 pg/cell (ref 26–32)
MCHC: 33 g/dL (ref 32–36)
MCV: 95 fL (ref 79–95)
Platelets: 220 10*3/uL (ref 160–370)
RBC: 4.1 MIL/uL (ref 3.9–5.2)
RDW: 12.1 % (ref 11.7–14.4)
WBC: 4.5 10*3/uL (ref 4.0–10.0)

## 2017-01-25 LAB — SYPHILIS SCREEN
Syphilis Screen: NEGATIVE
Syphilis Status: NONREACTIVE

## 2017-01-25 LAB — HSV 1 ANTIBODY, IGG: HSV 1 IgG: <0.2 AI

## 2017-01-25 LAB — HIV 1&2 ANTIGEN/ANTIBODY: HIV 1&2 ANTIGEN/ANTIBODY: NONREACTIVE

## 2017-01-25 LAB — HSV 2 ANTIBODY, IGG: HSV 2 IgG: <0.2 AI

## 2017-01-25 LAB — N. GONORRHOEAE DNA AMPLIFICATION: N. gonorrhoeae DNA Amplification: 0

## 2017-01-25 LAB — CHLAMYDIA PLASMID DNA AMPLIFICATION: Chlamydia Plasmid DNA Amplification: 0

## 2017-08-10 ENCOUNTER — Emergency Department
Admission: EM | Admit: 2017-08-10 | Discharge: 2017-08-10 | Disposition: A | Discharge status: Home / Self Care | Payer: MEDICAID | Source: Ambulatory Visit | Attending: Emergency Medicine | Admitting: Emergency Medicine

## 2017-08-10 ENCOUNTER — Inpatient Hospital Stay: Admit: 2017-08-10 | Discharge: 2017-08-10 | Disposition: A | Discharge status: Home / Self Care | Payer: Self-pay

## 2017-08-10 ENCOUNTER — Encounter: Payer: Self-pay | Admitting: Emergency Medicine

## 2017-08-10 DIAGNOSIS — R102 Pelvic and perineal pain: Secondary | ICD-10-CM | POA: Insufficient documentation

## 2017-08-10 DIAGNOSIS — F17210 Nicotine dependence, cigarettes, uncomplicated: Secondary | ICD-10-CM | POA: Insufficient documentation

## 2017-08-10 DIAGNOSIS — R55 Syncope and collapse: Secondary | ICD-10-CM

## 2017-08-10 DIAGNOSIS — R1030 Lower abdominal pain, unspecified: Secondary | ICD-10-CM

## 2017-08-10 DIAGNOSIS — R42 Dizziness and giddiness: Secondary | ICD-10-CM

## 2017-08-10 LAB — CBC AND DIFFERENTIAL
Baso # K/uL: 0 10*3/uL (ref 0.0–0.1)
Basophil %: 0.2 %
Eos # K/uL: 0 10*3/uL (ref 0.0–0.4)
Eosinophil %: 0.1 %
Hematocrit: 40 % (ref 34–45)
Hemoglobin: 13.7 g/dL (ref 11.2–15.7)
IMM Granulocytes #: 0.1 10*3/uL
IMM Granulocytes: 0.5 %
Lymph # K/uL: 0.9 10*3/uL — ABNORMAL LOW (ref 1.2–3.7)
Lymphocyte %: 6 %
MCH: 32 pg (ref 26–32)
MCHC: 34 g/dL (ref 32–36)
MCV: 92 fL (ref 79–95)
Mono # K/uL: 0.9 10*3/uL (ref 0.2–0.9)
Monocyte %: 5.9 %
Neut # K/uL: 13.2 10*3/uL — ABNORMAL HIGH (ref 1.6–6.1)
Nucl RBC # K/uL: 0 10*3/uL (ref 0.0–0.0)
Nucl RBC %: 0 /100 WBC (ref 0.0–0.2)
Platelets: 218 10*3/uL (ref 160–370)
RBC: 4.4 MIL/uL (ref 3.9–5.2)
RDW: 11.7 % (ref 11.7–14.4)
Seg Neut %: 87.3 %
WBC: 15.2 10*3/uL — ABNORMAL HIGH (ref 4.0–10.0)

## 2017-08-10 LAB — URINALYSIS REFLEX TO CULTURE
Blood,UA: NEGATIVE
Leuk Esterase,UA: NEGATIVE
Nitrite,UA: NEGATIVE
Protein,UA: NEGATIVE mg/dL
Specific Gravity,UA: 1.021 (ref 1.001–1.030)
pH,UA: 5 (ref 5.0–8.0)

## 2017-08-10 LAB — BASIC METABOLIC PANEL
Anion Gap: 10 (ref 7–16)
CO2: 22 mmol/L (ref 20–28)
Calcium: 8.4 mg/dL — ABNORMAL LOW (ref 8.8–10.2)
Chloride: 107 mmol/L (ref 96–108)
Creatinine: 0.76 mg/dL (ref 0.51–0.95)
GFR,Black: 125 *
GFR,Caucasian: 108 *
Glucose: 109 mg/dL — ABNORMAL HIGH (ref 60–99)
Lab: 12 mg/dL (ref 6–20)
Potassium: 4.1 mmol/L (ref 3.3–5.1)
Sodium: 139 mmol/L (ref 133–145)

## 2017-08-10 LAB — PREGNANCY TEST, SERUM: Preg,Serum: NEGATIVE

## 2017-08-10 MED ORDER — DEXTROSE 5 % FLUSH FOR PUMPS *I*
0.0000 mL/h | INTRAVENOUS | Status: DC | PRN
Start: 2017-08-10 — End: 2017-08-10

## 2017-08-10 MED ORDER — SODIUM CHLORIDE 0.9 % FLUSH FOR PUMPS *I*
0.0000 mL/h | INTRAVENOUS | Status: DC | PRN
Start: 2017-08-10 — End: 2017-08-10

## 2017-08-10 MED ORDER — KETOROLAC TROMETHAMINE 30 MG/ML IJ SOLN *I*
15.0000 mg | Freq: Once | INTRAMUSCULAR | Status: AC
Start: 2017-08-10 — End: 2017-08-10
  Administered 2017-08-10: 15 mg via INTRAVENOUS
  Filled 2017-08-10: qty 1

## 2017-08-10 MED ORDER — SODIUM CHLORIDE 0.9 % IV BOLUS *I*
1000.0000 mL | Freq: Once | Status: AC
Start: 2017-08-10 — End: 2017-08-10
  Administered 2017-08-10: 1000 mL via INTRAVENOUS

## 2017-08-10 NOTE — Discharge Instructions (Signed)
Rest, stay hydrated  Take Motrin for pain and use warm compresses   Follow-up with your GYN, return to the ED with worsening symptoms or concerns

## 2017-08-10 NOTE — Consults (Signed)
GYNECOLOGY CONSULTATION HISTORY & PHYSICAL      Consult Requested By: Curlene DolphinMiranda Caccamise, NP from ED  Consult Question: rule out ovarian torsion    HPI: Darlene Murphy is a 27 y.o.  G0P0 female who presented to the ED after having a syncopal episode this afternoon. She started her period today and had sudden onset intense abdominal pain, nausea and lightheadedness. She had an episode of syncope with this. She normally has painful and irregular periods, but has never had this happen before. Her pain has significantly improved - only has mild cramping currently. No nausea or vomiting. No known history of ovarian cysts. No on hormonal contraception.      Past Medical History   No past medical history on file.    Gynecologic History   Menstrual  LMP 6/12/19Menses are irregular with moderate cramping.     Sexual  Patient is sexually active. She reports having female partners    Past Surgical History     Past Surgical History:   Procedure Laterality Date    CYST REMOVAL      TONSILLECTOMY         Obstetric History     OB History   No data available       Social History     Social History   Substance Use Topics    Smoking status: Current Every Day Smoker     Packs/day: 1.00     Years: 4.00     Types: Cigarettes    Smokeless tobacco: Never Used    Alcohol use Yes      Comment: occasional       Family History     Family History     No family medical history recorded          Allergies     Allergies   Allergen Reactions    Azithromycin Rash       Current Home Medications   Medication reconciliation performed at the time of evaluation. See Med Rec portion of patient's chart.   The patient takes no pertinent medications.    Review of Systems  All other ROS negative unless otherwise noted in HPI.       Physical Exam     Vitals:    08/10/17 1250   BP: 95/71   Pulse: 50   Resp: 16   Temp: 35.9 C (96.6 F)   TempSrc: Temporal   SpO2: 99%   Weight: 52.2 kg (115 lb)   Height: 1.702 m (5\' 7" )       General: well developed and well  nourished, in no acute distress  Mental: alert and oriented  HEENT: Normocephalic, atraumatic  Cardiovascular: Not assessed  Respiratory: normal respiratory effort  Abdomen: soft, non-distended, nontender, no rebound or guarding  Extremities/Skin: No edema noted    Labs   All labs reviewed and notable for:    BHCG negative  WBC 15.2  Hct 40  Electrolytes unremarkable      Assessment & Recommendations     Darlene Murphy is a 27 y.o. G0P0 female who presents with acute onset abdominal pain, nausea/vomiting and a syncopal episode with the onset of her menstrual cycle. Symptoms have almost completely resolved. Abdominal exam benign and vitals stable. Given the clinical picture, most likely this had to do with the onset her her period and severe menstrual cramping. Less likely to be ovarian torsion given improvement in pain. Discussed that a pelvic US could be performed to assess for possible ovarian cyst  and thus probability of torsion. Patient preferred to not have an ultrasound at this time, which is reasonable given her clinical picture. Discussed that is the pain returns, she should come back for evaluation.    Patient ok to be discharged from GYN perspective.      D/w Dr. Dorena Bodo, MD  Obstetrics and Gynecology Resident, PGY2  Pager: [phone]

## 2017-08-10 NOTE — ED Notes (Signed)
Report Given To  Vernona RiegerLaura, RN      Descriptive Sentence / Reason for Admission   Pelvic Pain (Pt to ED with c/o pelvic pain, states she has never felt pain like this before.  Menses started today.  EMS reports she was on a bathroom floor when they found her.  Enroute pt became hypotensive SBP 80s.)        Active Issues / Relevant Events   AOx4  Room air  Independent  WBC 15.2      To Do List  Urine  Pain control  Finish Bolus  GYN ultrasound       Anticipatory Guidance / Discharge Planning  Pending labs and imaging

## 2017-08-10 NOTE — ED Notes (Signed)
Pt being discharged home, peripheral IV site removed, pt dressed, given discharge paperwork and follow up instructions.

## 2017-08-10 NOTE — ED Notes (Signed)
GYN team to see pt for US, pt refusing US d/t pt not having insurance.
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
## 2017-08-10 NOTE — ED Triage Notes (Signed)
Pt to ED with c/o pelvic pain, states she has never felt pain like this before.  Menses started today.  EMS reports she was on a bathroom floor when they found her.  Enroute pt became hypotensive SBP 80s.       Triage Note   Precious BardKristin C Kindel, RN

## 2017-08-10 NOTE — ED Provider Notes (Signed)
History     Chief Complaint   Patient presents with    Pelvic Pain     Pt to ED with c/o pelvic pain, states she has never felt pain like this before.  Menses started today.  EMS reports she was on a bathroom floor when they found her.  Enroute pt became hypotensive SBP 80s.     Patient is a 27 year old female who presents to the emergency department after syncopal episode.  Patient reports that she started her menstrual cycle today and had sudden onset of severe lower abdominal pain.  Patient reports that she developed nausea, no vomiting.  Patient reports she developed lightheadedness, dizziness and episode of syncope.  Patient reports that her menstrual cycles are typically painful but this has never happened before.  On arrival to the emergency department patient reports that her pain has improved.  EMS does report the patient was hypotensive in the 80s on their arrival.            History provided by:  Patient  Language interpreter used: No        Medical/Surgical/Family History     No past medical history on file.     There is no problem list on file for this patient.           Past Surgical History:   Procedure Laterality Date    CYST REMOVAL      TONSILLECTOMY       No family history on file.       Social History   Substance Use Topics    Smoking status: Current Every Day Smoker     Packs/day: 1.00     Years: 4.00     Types: Cigarettes    Smokeless tobacco: Never Used    Alcohol use Yes      Comment: occasional     Living Situation     Questions Responses    Patient lives with     Homeless     Caregiver for other family member     External Services     Employment     Domestic Violence Risk                 Review of Systems   Review of Systems   Constitutional: Negative for activity change, appetite change, chills, diaphoresis, fatigue, fever and unexpected weight change.   HENT: Negative.    Eyes: Negative for photophobia and visual disturbance.   Respiratory: Negative for apnea, cough, choking,  chest tightness, shortness of breath, wheezing and stridor.    Cardiovascular: Negative for chest pain, palpitations and leg swelling.   Gastrointestinal: Positive for abdominal pain and nausea. Negative for abdominal distention, constipation, diarrhea and vomiting.   Endocrine: Negative.    Genitourinary: Negative for dysuria, flank pain, frequency, hematuria and urgency.   Musculoskeletal: Negative for arthralgias, back pain, gait problem, joint swelling, myalgias, neck pain and neck stiffness.   Skin: Negative for color change, pallor, rash and wound.   Allergic/Immunologic: Negative.    Neurological: Positive for dizziness, syncope and light-headedness. Negative for weakness and headaches.   Hematological: Negative.    Psychiatric/Behavioral: Negative for self-injury and suicidal ideas.   All other systems reviewed and are negative.      Physical Exam     Triage Vitals  Triage Start: Start, (08/10/17 1248)   First Recorded BP: 95/71, Resp: 16, Temp: 35.9 C (96.6 F), Temp src: TEMPORAL Oxygen Therapy SpO2: 99 %, O2 Device: None (Room air),  Heart Rate: 50, (08/10/17 1250)  .  First Pain Reported  0-10 Scale: 10, Pain Location/Orientation: Pelvis, Pain Descriptors: Burning;Cramping, (08/10/17 1250)       Physical Exam   Constitutional: She is oriented to person, place, and time. She appears well-developed. No distress.   HENT:   Head: Normocephalic.   Eyes: Pupils are equal, round, and reactive to light. Conjunctivae and EOM are normal.   Neck: Normal range of motion. Neck supple.   Cardiovascular: Normal rate, regular rhythm and normal heart sounds.    Pulmonary/Chest: Effort normal and breath sounds normal. No respiratory distress. She has no wheezes. She has no rales. She exhibits no tenderness.   Abdominal: Soft. Bowel sounds are normal. She exhibits no distension. There is tenderness.   Abdomen soft, nondistended, mild suprapubic tenderness, worse on right side   Musculoskeletal: Normal range of motion. She  exhibits no edema, tenderness or deformity.   Neurological: She is alert and oriented to person, place, and time.   Skin: Skin is warm and dry. No rash noted. She is not diaphoretic. No erythema. No pallor.   Psychiatric: She has a normal mood and affect. Her behavior is normal. Judgment and thought content normal.   Nursing note and vitals reviewed.      Medical Decision Making      Amount and/or Complexity of Data Reviewed  Clinical lab tests: reviewed and ordered        Initial Evaluation:  ED First Provider Contact     Date/Time Event User Comments    08/10/17 1304 ED First Provider Contact Talishia Betzler Initial Face to Face Provider Contact          Patient seen by me on arrival date of 08/10/2017.    Assessment:  27 y.o.female comes to the ED with complaint of abdominal pain, sudden onset      Differential Diagnosis includes:  Ovarian torsion, ovarian cyst, dysmenorrhea    Plan:   CBC with diff  BMP  Urine/pregnancy   Toradol   IV hydration     Labs showed:  Labs Reviewed   URINALYSIS REFLEX TO CULTURE - Abnormal; Notable for the following:        Result Value    Color, UA Lt Yellow (*)     Ketones, UA 2+ (*)     All other components within normal limits   CBC AND DIFFERENTIAL - Abnormal; Notable for the following:     WBC 15.2 (*)     Neut # K/uL 13.2 (*)     Lymph # K/uL 0.9 (*)     All other components within normal limits   BASIC METABOLIC PANEL - Abnormal; Notable for the following:     Glucose 109 (*)     Calcium 8.4 (*)     All other components within normal limits   PREGNANCY TEST, SERUM   UA WITH REFLEX TO CULTURE     Plan for OBGYN consult  Plan and recommendation per OBGYN:  Less likely to be ovarian torsion and improvement in her pain.  Patient was offered pelvic ultrasound but declined.  Plan to discharge patient home with instructions to follow-up with GYN and to return with worsening symptoms or concerns.  Patient is agreeable with this plan and understands return precautions.            Curlene DolphinMiranda Raychelle Hudman, NP          [place], [place], [place]  08/10/17 [phone]

## 2017-10-10 ENCOUNTER — Emergency Department
Admission: EM | Admit: 2017-10-10 | Discharge: 2017-10-10 | Disposition: A | Discharge status: Home / Self Care | Payer: MEDICAID | Source: Ambulatory Visit | Attending: Emergency Medicine | Admitting: Emergency Medicine

## 2017-10-10 ENCOUNTER — Encounter: Payer: Self-pay | Admitting: Emergency Medicine

## 2017-10-10 ENCOUNTER — Emergency Department: Payer: MEDICAID

## 2017-10-10 ENCOUNTER — Other Ambulatory Visit: Payer: Self-pay | Admitting: Cardiology

## 2017-10-10 DIAGNOSIS — R1010 Upper abdominal pain, unspecified: Secondary | ICD-10-CM

## 2017-10-10 DIAGNOSIS — R110 Nausea: Secondary | ICD-10-CM | POA: Insufficient documentation

## 2017-10-10 DIAGNOSIS — R1084 Generalized abdominal pain: Secondary | ICD-10-CM

## 2017-10-10 DIAGNOSIS — Z3202 Encounter for pregnancy test, result negative: Secondary | ICD-10-CM | POA: Insufficient documentation

## 2017-10-10 DIAGNOSIS — R197 Diarrhea, unspecified: Secondary | ICD-10-CM

## 2017-10-10 DIAGNOSIS — I499 Cardiac arrhythmia, unspecified: Secondary | ICD-10-CM

## 2017-10-10 DIAGNOSIS — K219 Gastro-esophageal reflux disease without esophagitis: Secondary | ICD-10-CM | POA: Insufficient documentation

## 2017-10-10 LAB — CBC AND DIFFERENTIAL
Baso # K/uL: 0 10*3/uL (ref 0.0–0.1)
Basophil %: 0.6 %
Eos # K/uL: 0 10*3/uL (ref 0.0–0.4)
Eosinophil %: 0.6 %
Hematocrit: 41 % (ref 34–45)
Hemoglobin: 14.3 g/dL (ref 11.2–15.7)
IMM Granulocytes #: 0 10*3/uL
IMM Granulocytes: 0 %
Lymph # K/uL: 1.4 10*3/uL (ref 1.2–3.7)
Lymphocyte %: 37.9 %
MCH: 32 pg (ref 26–32)
MCHC: 35 g/dL (ref 32–36)
MCV: 91 fL (ref 79–95)
Mono # K/uL: 0.4 10*3/uL (ref 0.2–0.9)
Monocyte %: 12 %
Neut # K/uL: 1.8 10*3/uL (ref 1.6–6.1)
Nucl RBC # K/uL: 0 10*3/uL (ref 0.0–0.0)
Nucl RBC %: 0 /100 WBC (ref 0.0–0.2)
Platelets: 193 10*3/uL (ref 160–370)
RBC: 4.5 MIL/uL (ref 3.9–5.2)
RDW: 11.2 % — ABNORMAL LOW (ref 11.7–14.4)
Seg Neut %: 48.9 %
WBC: 3.6 10*3/uL — ABNORMAL LOW (ref 4.0–10.0)

## 2017-10-10 LAB — BASIC METABOLIC PANEL
Anion Gap: 11 (ref 7–16)
CO2: 24 mmol/L (ref 20–28)
Calcium: 9.3 mg/dL (ref 8.8–10.2)
Chloride: 104 mmol/L (ref 96–108)
Creatinine: 0.81 mg/dL (ref 0.51–0.95)
GFR,Black: 115 *
GFR,Caucasian: 100 *
Glucose: 88 mg/dL (ref 60–99)
Lab: 12 mg/dL (ref 6–20)
Potassium: 4 mmol/L (ref 3.3–5.1)
Sodium: 139 mmol/L (ref 133–145)

## 2017-10-10 LAB — RUQ PANEL (ED ONLY)
ALT: 16 U/L (ref 0–35)
AST: 18 U/L (ref 0–35)
Albumin: 4.7 g/dL (ref 3.5–5.2)
Alk Phos: 40 U/L (ref 35–105)
Amylase: 53 U/L (ref 28–100)
Bilirubin,Direct: <0.2 mg/dL (ref 0.0–0.3)
Bilirubin,Total: 0.4 mg/dL (ref 0.0–1.2)
Globulin: 2.5 g/dL — ABNORMAL LOW (ref 2.7–4.3)
Lipase: 26 U/L (ref 13–60)
Total Protein: 7.2 g/dL (ref 6.3–7.7)

## 2017-10-10 LAB — POCT URINE PREGNANCY: Preg Test,UR POC: NEGATIVE

## 2017-10-10 MED ORDER — DEXTROSE 5 % FLUSH FOR PUMPS *I*
0.0000 mL/h | INTRAVENOUS | Status: DC | PRN
Start: 2017-10-10 — End: 2017-10-10

## 2017-10-10 MED ORDER — PANTOPRAZOLE SODIUM 40 MG IV SOLR *I*
40.0000 mg | Freq: Once | INTRAVENOUS | Status: AC
Start: 2017-10-10 — End: 2017-10-10
  Administered 2017-10-10: 40 mg via INTRAVENOUS
  Filled 2017-10-10: qty 10

## 2017-10-10 MED ORDER — SODIUM CHLORIDE 0.9 % FLUSH FOR PUMPS *I*
0.0000 mL/h | INTRAVENOUS | Status: DC | PRN
Start: 2017-10-10 — End: 2017-10-10

## 2017-10-10 MED ORDER — PANTOPRAZOLE SODIUM 40 MG PO TBEC *I*
40.0000 mg | DELAYED_RELEASE_TABLET | Freq: Every day | ORAL | 0 refills | Status: DC
Start: 2017-10-10 — End: 2019-12-10

## 2017-10-10 MED ORDER — SODIUM CHLORIDE 0.9 % IV SOLN WRAPPED *I*
125.0000 mL/h | Status: DC
Start: 2017-10-10 — End: 2017-10-10
  Administered 2017-10-10: 125 mL/h via INTRAVENOUS

## 2017-10-10 MED ORDER — SODIUM CHLORIDE 0.9 % IV BOLUS *I*
500.0000 mL | Freq: Once | Status: AC
Start: 2017-10-10 — End: 2017-10-10
  Administered 2017-10-10: 500 mL via INTRAVENOUS

## 2017-10-10 MED ORDER — ONDANSETRON HCL 2 MG/ML IV SOLN *I*
4.0000 mg | Freq: Once | INTRAMUSCULAR | Status: AC
Start: 2017-10-10 — End: 2017-10-10
  Administered 2017-10-10: 4 mg via INTRAVENOUS
  Filled 2017-10-10: qty 2

## 2017-10-10 NOTE — ED Triage Notes (Signed)
Pt to ED with c/o nausea and cramping for a week.  Reports symptoms are worse after eating.  Denies chance of pregnacy.       Triage Note   Precious BardKristin C Kindel, RN

## 2017-10-10 NOTE — ED Provider Notes (Addendum)
History     Chief Complaint   Patient presents with    Nausea     Pt to ED with c/o nausea and cramping for a week.  Reports symptoms are worse after eating.  Denies chance of pregnacy.     27 yo female w/ history of GERD presenting with abdominal pain.    Patient describes constant abdominal pain for the past 4 days associated with nausea but no vomiting. The pain is burning in quality and worse when she wakes up in the morning, but also becomes crampy particularly after meals. Food generally exacerbates her symptoms and she has not been eating much as a result. The pain is located over her entire abdomen she states. She describes loose stools during this period of time and one episode of stool w/ mucous. She also has had constipation once within these past 4 days. She has not pain with defecation or blood/black color in her stools. She is able to walk and jump up and down. She does not feel febrile and notes no recent weight loss. Overall, her symptoms have left her feeling fatigued and she has been in bed for the past 4 days as a result of her discomfort.    Patient has no history of abdominal surgeries. She does not report recent travel. She does not take NSAIDs regularly.    Patient is sexually active with women. She has not known history of STIs. She does not have any abnormal vaginal discharge, bleeding, or dysuria.    Patient has a history of GERD three years ago that used to wake her up at night. During these episodes she had burning chest pain and was experiencing reflux of food into her throat. At this time she took nexium which helped alleviate her symptoms. She has not had symptoms of GERD in three years and states her current abdominal pain is different from her past episodes of GERD.     Additional complaints include spontaneous bleeding of gums recently along with sores on the inner aspect of her lower lip that appeared two days ago and self resolved yesterday.            Medical/Surgical/Family  History     No past medical history on file.     There is no problem list on file for this patient.           Past Surgical History:   Procedure Laterality Date    CYST REMOVAL      TONSILLECTOMY       No family history on file.       Social History   Substance Use Topics    Smoking status: Current Every Day Smoker     Packs/day: 1.00     Years: 4.00     Types: Cigarettes    Smokeless tobacco: Never Used    Alcohol use Yes      Comment: occasional     Living Situation     Questions Responses    Patient lives with     Homeless     Caregiver for other family member     External Services     Employment     Domestic Violence Risk                 Review of Systems   Review of Systems   Constitutional: Positive for appetite change and fatigue. Negative for chills, diaphoresis, fever and unexpected weight change.   HENT: Positive for mouth sores. Negative  for congestion, nosebleeds, postnasal drip, rhinorrhea, sore throat and trouble swallowing.         Gingival bleeding that occurs spontaneously    Eyes: Negative for discharge, redness, itching and visual disturbance.   Respiratory: Negative for cough, chest tightness, shortness of breath and wheezing.    Cardiovascular: Negative for chest pain, palpitations and leg swelling.        Notes increased heart rate after only minor activity    Gastrointestinal: Positive for abdominal pain, constipation, diarrhea and nausea. Negative for abdominal distention, blood in stool, rectal pain and vomiting.   Endocrine: Negative for polyuria.   Genitourinary: Negative for difficulty urinating, dysuria, flank pain, frequency, hematuria, menstrual problem, pelvic pain, urgency, vaginal bleeding and vaginal discharge.   Musculoskeletal: Negative for arthralgias, back pain, joint swelling and myalgias.   Skin: Negative for pallor and rash.   Allergic/Immunologic: Positive for environmental allergies.   Neurological: Positive for weakness and light-headedness. Negative for dizziness,  tremors, syncope, facial asymmetry and headaches.   Hematological: Does not bruise/bleed easily.   Psychiatric/Behavioral: Negative for agitation, behavioral problems and confusion. The patient is not nervous/anxious.        Physical Exam     Triage Vitals  Triage Start: Start, (10/10/17 1117)   First Recorded BP: 108/62, Resp: 18, Temp: 37 C (98.6 F), Temp src: TEMPORAL Oxygen Therapy SpO2: 97 %, Heart Rate: 77, (10/10/17 1117)  .  First Pain Reported  0-10 Scale: 3, (10/10/17 1117)       Physical Exam   Constitutional: She is oriented to person, place, and time. She appears well-developed.   No mucosal sores or vesicles appreciated   HENT:   Head: Normocephalic and atraumatic.   Mouth/Throat: Oropharynx is clear and moist. No oropharyngeal exudate.   Eyes: Pupils are equal, round, and reactive to light. Conjunctivae and EOM are normal.   No conjunctival pallor    Neck: Normal range of motion. Neck supple.   Cardiovascular: Normal rate and regular rhythm.    No murmur heard.  Pulmonary/Chest: Effort normal and breath sounds normal. No respiratory distress. She has no wheezes. She has no rales.   Abdominal: Soft. She exhibits no distension. There is no tenderness. There is no rebound and no guarding.   High pitched abdominal sounds present    Musculoskeletal: She exhibits no edema or tenderness.   Lymphadenopathy:     She has no cervical adenopathy.   Neurological: She is alert and oriented to person, place, and time. No sensory deficit. She exhibits normal muscle tone.   Skin: Skin is warm and dry. Capillary refill takes less than 2 seconds. No rash noted. She is not diaphoretic.   No nailbed pallor    Psychiatric: She has a normal mood and affect. Her behavior is normal.   Nursing note and vitals reviewed.      Medical Decision Making   Patient seen by me on:  10/10/2017    Assessment:  27 yo history w/ remote history of GERD presenting with four day history of burning and crampy abdominal pain w/ associated  nausea and diarrhea. The pain is worse in the morning and with food; her abdominal exam was benign. Given her history of GERD, her benign exam, and the nature of the pain, the patient's presentation is most consistent with gastritis or PUD. Her report of mouth sores does raise concern for IBD, but in the absence of fever hematochezia this seems less likely. The acute onset and nature of the pain  are also less suggestive of IBD. If workup is negative, her history of both diarrhea and constipation could be attributed to IBS, however this should be considered as a diagnosis of exclusion after other possibilities have been ruled out.    Differential diagnosis:  Gastritis/PUD  H. Pylori infection   UTI  Crohns  IBD  Appendicitis   Cholelithiasis   Pancreatitis     Plan:    CBC and BMP  H. Pylori stool antigen   RUQ panel   RUQ and abdominal US  Urinalysis   POCT serum pregnancy   zofran 4 mg   40 mg pantoprazole     Independent review of: Existing labs, chart/prior records    ED Course and Disposition:  Pregnancy test negative             Darlene KindredJohn Naida Escalante

## 2017-10-10 NOTE — Discharge Instructions (Signed)
You came to the ED because of 4 days of burning abdominal pain associated with nausea. Your lab results did not reveal anything suggestive of infection, bleeding or electrolyte abnormality. An ultrasound of your abdomen was not suggestive of gallstones or gallbladder disease.     You were given protonix and zofran, which helped improve your nausea. You continued to have abdominal pain but overall appeared comfortable. You were sent home with a script for protonix and H pylori stool antigen testing. H pylori is a bacteria that can cause stomach ulcers and we want to rule out this infection. You were not able to have a bowel movement in the ED so we instructed you to get a stool sample at home and then bring the sample to a lab.    Your instructions include   - prior to taking your protonix daily, you obtain a stool sample for the H pylori antigen test   - take protonix daily  - follow up with gastrointestinal specialist regarding abdominal pain and/or positive H pylori test.  - follow up with PCP with discussion regarding possible irritable bowel syndrome   - return to the ED if you experience worsening abdominal pain, severe vomiting, fever, blood in the stool.

## 2017-10-10 NOTE — ED Notes (Signed)
No further c/o. Discharge instructions given with verbal understanding by patient. VSS.

## 2017-10-10 NOTE — ED Notes (Signed)
10/10/17 1011   Expected Call-In Information   [facility]   PCP/Service Referral Dr Greggory StallionGeorge Plain   Pt Info note/Reason for sending Nausea,dizziness,abd pain   Pt Coming from MD Office   Call reported to aew

## 2017-10-10 NOTE — ED Notes (Signed)
C/O nausea /crampingafter eating.

## 2017-10-11 ENCOUNTER — Other Ambulatory Visit
Admission: RE | Admit: 2017-10-11 | Discharge: 2017-10-11 | Disposition: A | Discharge status: Home / Self Care | Payer: MEDICAID | Source: Ambulatory Visit | Attending: Emergency Medicine | Admitting: Emergency Medicine

## 2017-10-11 DIAGNOSIS — Z0189 Encounter for other specified special examinations: Secondary | ICD-10-CM | POA: Insufficient documentation

## 2017-10-11 LAB — H. PYLORI ANTIGEN, STOOL: H. pylori Stool Antigen EIA: 0

## 2017-10-12 LAB — EKG 12-LEAD
P: 77 deg
PR: 136 ms
QRS: 84 deg
QRSD: 82 ms
QT: 384 ms
QTc: 443 ms
Rate: 80 {beats}/min
T: 91 deg

## 2017-10-14 ENCOUNTER — Ambulatory Visit
Admission: AD | Admit: 2017-10-14 | Discharge: 2017-10-14 | Disposition: A | Discharge status: Home / Self Care | Payer: MEDICAID | Source: Ambulatory Visit | Attending: Family Medicine | Admitting: Family Medicine

## 2017-10-14 DIAGNOSIS — M25562 Pain in left knee: Secondary | ICD-10-CM | POA: Insufficient documentation

## 2017-10-14 DIAGNOSIS — R42 Dizziness and giddiness: Secondary | ICD-10-CM | POA: Insufficient documentation

## 2017-10-14 DIAGNOSIS — W57XXXA Bitten or stung by nonvenomous insect and other nonvenomous arthropods, initial encounter: Secondary | ICD-10-CM | POA: Insufficient documentation

## 2017-10-14 DIAGNOSIS — D729 Disorder of white blood cells, unspecified: Secondary | ICD-10-CM | POA: Insufficient documentation

## 2017-10-14 DIAGNOSIS — M25552 Pain in left hip: Secondary | ICD-10-CM | POA: Insufficient documentation

## 2017-10-14 DIAGNOSIS — Z113 Encounter for screening for infections with a predominantly sexual mode of transmission: Secondary | ICD-10-CM | POA: Insufficient documentation

## 2017-10-14 DIAGNOSIS — G8929 Other chronic pain: Secondary | ICD-10-CM | POA: Insufficient documentation

## 2017-10-14 DIAGNOSIS — R51 Headache: Secondary | ICD-10-CM | POA: Insufficient documentation

## 2017-10-14 DIAGNOSIS — M25561 Pain in right knee: Secondary | ICD-10-CM | POA: Insufficient documentation

## 2017-10-14 DIAGNOSIS — R5383 Other fatigue: Secondary | ICD-10-CM | POA: Insufficient documentation

## 2017-10-14 DIAGNOSIS — M25551 Pain in right hip: Secondary | ICD-10-CM | POA: Insufficient documentation

## 2017-10-14 NOTE — Discharge Instructions (Signed)
Continue your GERD/gastritis dietary changes.  Take Tylenol as needed for pain/discomfort.  Follow label instructions carefully.  Keep your GI Clinic appointment as scheduled.  Follow up with PCP or GI for repeat WBC testing.

## 2017-10-14 NOTE — UC Provider Note (Signed)
History     Chief Complaint   Patient presents with    Joint Pain     Patient c/o joint pain, patient c/o nausea, dizziness and lightheadedness.  Patient got bit by a tick in May and would like to get tested for Lyme disease.     Dizziness     Patient presents with adult female friend for persisting fatigue, new-onset arthralgias and nasal bridge and frontal headache.  Patient was evaluated in June for pelvic pain but declined complete workup due to lack of health insurance.  At that time her white blood cell count was 15.2 with a normal differential.  Patient evaluated 4 days ago in the emergency department for acute abdominal pain.  Gastritis/GERD was the diagnosis.  WBC count 3.4 earlier this week.  Pregnancy test negative at that time.  Patient stopped all caffeine intake and is following a GERD diet very carefully.  GERD symptoms have improved but persisted mildly.  Patient is tolerating food and fluids.  Tick bite on the back of her neck in May while hiking in the woods.  Unclear duration of tick..  No rashes.  See review of systems.            Medical/Surgical/Family History     History reviewed. No pertinent past medical history.     There is no problem list on file for this patient.           Past Surgical History:   Procedure Laterality Date    CYST REMOVAL      TONSILLECTOMY       No family history on file.       Social History   Substance Use Topics    Smoking status: Current Every Day Smoker     Packs/day: 1.00     Years: 4.00     Types: Cigarettes    Smokeless tobacco: Never Used    Alcohol use Yes      Comment: occasional     Living Situation     Questions Responses    Patient lives with     Homeless     Caregiver for other family member     External Services     Employment Employed    Domestic Violence Risk                 Review of Systems   Review of Systems   Constitutional: Positive for appetite change and fatigue. Negative for activity change, chills, fever and unexpected weight change.    HENT: Negative.    Respiratory: Negative for cough and shortness of breath.    Cardiovascular: Negative for chest pain.   Gastrointestinal: Positive for abdominal pain.   Genitourinary: Negative for decreased urine volume, difficulty urinating and frequency.   Neurological: Positive for light-headedness and headaches.   Hematological: Negative for adenopathy. Does not bruise/bleed easily.   Psychiatric/Behavioral: Negative for confusion.       Physical Exam   Triage Vitals  Triage Start: Start, (10/14/17 1937)   First Recorded BP: 107/66, Temp: 36.4 C (97.5 F), Temp src: TEMPORAL Oxygen Therapy SpO2: 100 %, Oximetry Source: Lt Hand, O2 Device: None (Room air), Heart Rate: 66, (10/14/17 1931) Heart Rate (via Pulse Ox): 66, (10/14/17 1931).  First Pain Reported  0-10 Scale: 3, Pain Location/Orientation: Generalized, (10/14/17 1939)       Physical Exam   Constitutional: She is oriented to person, place, and time. She appears well-developed and well-nourished. No distress.   HENT:  Head: Normocephalic and atraumatic.   Eyes: Conjunctivae and EOM are normal.   Neck: Neck supple. No thyromegaly present.   Cardiovascular: Normal rate, regular rhythm and normal heart sounds.  Exam reveals no gallop and no friction rub.    No murmur heard.  Pulmonary/Chest: Effort normal and breath sounds normal. No respiratory distress. She has no wheezes. She has no rales.   Lymphadenopathy:     She has no cervical adenopathy.   Neurological: She is alert and oriented to person, place, and time.   Skin: Skin is warm and dry.   Psychiatric: She has a normal mood and affect. Her behavior is normal. Judgment and thought content normal.   Nursing note and vitals reviewed.       Medical Decision Making        Initial Evaluation:  ED First Provider Contact     Date/Time Event User Comments    10/14/17 1936 ED First Provider Contact Janyce LlanosPIOTROWSKI, Daksh Coates M Initial Face to Face Provider Contact          Patient was seen on:  10/14/2017        Assessment:  27 y.o.female comes to the Urgent Care Center with arthralgias, fatigue, headache,     Differential Diagnosis includes:  Lyme disease  Acute HIV  Myeloproliferative d/o(unlikely but WBC follow up needed)  Caffeine withdrawal  IBS/IBD      Plan:   Routine written and verbal instructions and alert signs/symptoms given to patient and emphasized.     Continue your GERD/gastritis dietary changes.  Take Tylenol as needed for pain/discomfort.  Follow label instructions carefully.  Keep your GI Clinic appointment as scheduled.  Follow up with PCP or GI for repeat WBC testing.     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 Tick bite, initial encounter W57.XXXA 919.4     E906.4   3. Abnormal WBC count D72.9 288.9                Please follow up with your physician as below:    Follow-up Information     Schedule an appointment as soon as possible for a visit  with Plain,   Greggory StallionGeorge, MD.    Specialty:  Internal Medicine  Contact information:  [address]  [address]  [place] [id number]  [phone]                 Thank you Leonette Nuttinglexis Morocco for coming to [facility] for your health care concerns.    If your condition changes and/or worsens please follow up with her primary doctor and/or return to the urgent care center.        In the event of an Emergency dial 911.           Final Diagnosis  Final diagnoses:   [M25.551, G89.29, M25.561, M25.552, M25.562] Chronic arthralgias of knees and hips (Primary)   [B14[W57.XXXA] Tick bite, initial encounter   [D72.9] Abnormal WBC count  Alcus DadSUZANNE M Antwoin Lackey, MD              Alcus DadPiotrowski, Reynard Christoffersen M, MD  10/14/17 Elisha Ponder2015

## 2017-10-14 NOTE — ED Triage Notes (Signed)
Patient c/o joint pain, patient c/o nausea, dizziness and lightheadedness.  Patient got bit by a tick in May and would like to get tested for Lyme disease.        Triage Note   Payton SparkJulie L Luisdavid Hamblin, RN

## 2017-10-15 LAB — HEPATITIS B & C PROF
HBV Core Ab: NEGATIVE
HBV S Ab Quant: 25.41 m[IU]/mL
HBV S Ab: POSITIVE
HBV S Ag: NEGATIVE
Hep C Ab: NEGATIVE

## 2017-10-15 LAB — SYPHILIS SCREEN
Syphilis Screen: NEGATIVE
Syphilis Status: NONREACTIVE

## 2017-10-15 LAB — HIV 1&2 ANTIGEN/ANTIBODY: HIV 1&2 ANTIGEN/ANTIBODY: NONREACTIVE

## 2017-10-15 LAB — LYME AB SCREEN: Lyme AB Screen: NEGATIVE

## 2017-10-17 LAB — HIV VIRAL LOAD
HIV-1 PCR log: NEGATIVE copies/mL
HIV-1 PCR: NEGATIVE copies/mL

## 2017-10-26 ENCOUNTER — Telehealth: Payer: Self-pay

## 2017-10-26 NOTE — Telephone Encounter (Signed)
Melinda of Dr. Margret ChancePlain's office is calling about status of appointment request. Faxed over new notes which are in media tab. She would like us to call her mother Jake Samplesthena as the patient may be hard to contact. Jake Samplesthena can be reached at [phone]

## 2017-10-27 NOTE — Telephone Encounter (Signed)
Referral is in progress, waiting availability.

## 2017-10-27 NOTE — Telephone Encounter (Signed)
Spoke with mom, notified her that we currently don't have an available appt within the recommended time frame and that she has been placed on our wait list for cancellations and will be contacted once an appt becomes available.

## 2017-10-27 NOTE — Telephone Encounter (Signed)
Referral received and sent to scanning

## 2017-10-27 NOTE — Telephone Encounter (Signed)
Returned call spoke to ShinglehouseMelinda, notified that patient is on our wait list for cancellations

## 2017-10-27 NOTE — Telephone Encounter (Signed)
nslin, Brand MalesSarah M, PA - RE: ED REFERRAL << Less Detail',event)" href="javascript:;">More Detail >>   RE: ED REFERRAL   Gaspar Biddingnslin, Sarah M, [place]   Sent: Wed October 12, 2017 7:42 AM   To: Blinda LeatherwoodBalkum, Jaziah Kwasnik                Message     OV within 6-8 wks

## 2017-10-27 NOTE — Telephone Encounter (Signed)
Juliette AlcideMelinda from PCP Office, is calling to schedule an appointment with GI. Per urgent referral, the patient would like to be seen for Abdominal pain, Abdominal burning, and bloating. Dr. Frederic JerichoPlain (PCP) is requesting for the patient to be seen asap.    Please call the patients mother, Jake Samplesthena, to schedule at [phone].  .Marland Kitchen

## 2017-11-01 ENCOUNTER — Encounter: Payer: Self-pay | Admitting: Gastroenterology

## 2017-11-07 ENCOUNTER — Encounter: Payer: Self-pay | Admitting: Gastroenterology

## 2017-11-11 NOTE — Telephone Encounter (Signed)
Called patient no answer and vcm was full unable to leave a message

## 2017-11-11 NOTE — Telephone Encounter (Signed)
Called the patient's mother, Athena,left message on vcm

## 2018-01-31 ENCOUNTER — Other Ambulatory Visit: Payer: Self-pay | Admitting: Cardiology

## 2018-01-31 ENCOUNTER — Encounter: Payer: Self-pay | Admitting: Emergency Medicine

## 2018-01-31 ENCOUNTER — Observation Stay
Admission: EM | Admit: 2018-01-31 | Discharge: 2018-02-01 | Disposition: A | Discharge status: Home / Self Care | Payer: Medicaid Other | Source: Ambulatory Visit | Attending: Emergency Medicine | Admitting: Emergency Medicine

## 2018-01-31 ENCOUNTER — Encounter: Payer: Medicaid Other | Admitting: Cardiology

## 2018-01-31 ENCOUNTER — Emergency Department: Payer: Medicaid Other

## 2018-01-31 DIAGNOSIS — F17210 Nicotine dependence, cigarettes, uncomplicated: Secondary | ICD-10-CM | POA: Insufficient documentation

## 2018-01-31 DIAGNOSIS — R55 Syncope and collapse: Principal | ICD-10-CM | POA: Insufficient documentation

## 2018-01-31 DIAGNOSIS — R5383 Other fatigue: Secondary | ICD-10-CM

## 2018-01-31 DIAGNOSIS — R9431 Abnormal electrocardiogram [ECG] [EKG]: Secondary | ICD-10-CM

## 2018-01-31 DIAGNOSIS — R110 Nausea: Secondary | ICD-10-CM

## 2018-01-31 DIAGNOSIS — R197 Diarrhea, unspecified: Secondary | ICD-10-CM | POA: Insufficient documentation

## 2018-01-31 DIAGNOSIS — R000 Tachycardia, unspecified: Secondary | ICD-10-CM | POA: Insufficient documentation

## 2018-01-31 DIAGNOSIS — Z79899 Other long term (current) drug therapy: Secondary | ICD-10-CM | POA: Insufficient documentation

## 2018-01-31 DIAGNOSIS — R51 Headache: Secondary | ICD-10-CM | POA: Insufficient documentation

## 2018-01-31 DIAGNOSIS — D72829 Elevated white blood cell count, unspecified: Secondary | ICD-10-CM | POA: Insufficient documentation

## 2018-01-31 DIAGNOSIS — R1084 Generalized abdominal pain: Secondary | ICD-10-CM

## 2018-01-31 LAB — CBC AND DIFFERENTIAL
Baso # K/uL: 0 10*3/uL (ref 0.0–0.1)
Basophil %: 0.2 %
Eos # K/uL: 0 10*3/uL (ref 0.0–0.4)
Eosinophil %: 0.1 %
Hematocrit: 40 % (ref 34–45)
Hemoglobin: 13.7 g/dL (ref 11.2–15.7)
IMM Granulocytes #: 0.1 10*3/uL
IMM Granulocytes: 0.4 %
Lymph # K/uL: 0.4 10*3/uL — ABNORMAL LOW (ref 1.2–3.7)
Lymphocyte %: 2.7 %
MCH: 31 pg (ref 26–32)
MCHC: 34 g/dL (ref 32–36)
MCV: 91 fL (ref 79–95)
Mono # K/uL: 0.7 10*3/uL (ref 0.2–0.9)
Monocyte %: 5.7 %
Neut # K/uL: 11.8 10*3/uL — ABNORMAL HIGH (ref 1.6–6.1)
Nucl RBC # K/uL: 0 10*3/uL (ref 0.0–0.0)
Nucl RBC %: 0 /100 WBC (ref 0.0–0.2)
Platelets: 174 10*3/uL (ref 160–370)
RBC: 4.4 MIL/uL (ref 3.9–5.2)
RDW: 11.4 % — ABNORMAL LOW (ref 11.7–14.4)
Seg Neut %: 90.9 %
WBC: 13 10*3/uL — ABNORMAL HIGH (ref 4.0–10.0)

## 2018-01-31 LAB — RUQ PANEL (ED ONLY)
ALT: 11 U/L (ref 0–35)
AST: 17 U/L (ref 0–35)
Albumin: 4.6 g/dL (ref 3.5–5.2)
Alk Phos: 42 U/L (ref 35–105)
Amylase: 44 U/L (ref 28–100)
Bilirubin,Direct: <0.2 mg/dL (ref 0.0–0.3)
Bilirubin,Total: 0.4 mg/dL (ref 0.0–1.2)
Globulin: 2.3 g/dL — ABNORMAL LOW (ref 2.7–4.3)
Lipase: 20 U/L (ref 13–60)
Total Protein: 6.9 g/dL (ref 6.3–7.7)

## 2018-01-31 LAB — BASIC METABOLIC PANEL
Anion Gap: 13 (ref 7–16)
CO2: 22 mmol/L (ref 20–28)
Calcium: 9.2 mg/dL (ref 8.8–10.2)
Chloride: 106 mmol/L (ref 96–108)
Creatinine: 0.86 mg/dL (ref 0.51–0.95)
GFR,Black: 107 *
GFR,Caucasian: 93 *
Glucose: 97 mg/dL (ref 60–99)
Lab: 11 mg/dL (ref 6–20)
Potassium: 4 mmol/L (ref 3.3–5.1)
Sodium: 141 mmol/L (ref 133–145)

## 2018-01-31 LAB — POCT URINE PREGNANCY: Preg Test,UR POC: NEGATIVE

## 2018-01-31 LAB — TYPE AND SCREEN
ABO RH Blood Type: O POS
Antibody Screen: NEGATIVE

## 2018-01-31 MED ORDER — SODIUM CHLORIDE 0.9 % IV BOLUS *I*
1000.0000 mL | Freq: Once | Status: AC
Start: 2018-01-31 — End: 2018-01-31
  Administered 2018-01-31: 1000 mL via INTRAVENOUS

## 2018-01-31 MED ORDER — METOCLOPRAMIDE HCL 5 MG/ML IJ SOLN *I*
10.0000 mg | Freq: Once | INTRAMUSCULAR | Status: AC
Start: 2018-01-31 — End: 2018-01-31
  Administered 2018-01-31: 10 mg via INTRAVENOUS
  Filled 2018-01-31: qty 2

## 2018-01-31 MED ORDER — IOHEXOL 350 MG/ML (OMNIPAQUE) IV SOLN *I*
1.0000 mL | Freq: Once | INTRAVENOUS | Status: AC
Start: 2018-01-31 — End: 2018-01-31
  Administered 2018-01-31: 71 mL via INTRAVENOUS

## 2018-01-31 MED ORDER — STERILE WATER FOR IRRIGATION IR SOLN *I*
900.0000 mL | Freq: Once | Status: AC
Start: 2018-01-31 — End: 2018-01-31
  Administered 2018-01-31: 900 mL via ORAL

## 2018-01-31 MED ORDER — SODIUM CHLORIDE 0.9 % FLUSH FOR PUMPS *I*
0.0000 mL/h | INTRAVENOUS | Status: DC | PRN
Start: 2018-01-31 — End: 2018-02-01

## 2018-01-31 MED ORDER — BISMUTH SUBSALICYLATE 262 MG/15ML PO SUSP *I*
30.0000 mL | Freq: Once | ORAL | Status: AC
Start: 2018-01-31 — End: 2018-01-31
  Administered 2018-01-31: 30 mL via ORAL
  Filled 2018-01-31: qty 118

## 2018-01-31 MED ORDER — ONDANSETRON HCL 2 MG/ML IV SOLN *I*
4.0000 mg | Freq: Once | INTRAMUSCULAR | Status: AC
Start: 2018-01-31 — End: 2018-01-31
  Administered 2018-01-31: 4 mg via INTRAVENOUS
  Filled 2018-01-31: qty 2

## 2018-01-31 MED ORDER — KETOROLAC TROMETHAMINE 30 MG/ML IJ SOLN *I*
15.0000 mg | Freq: Once | INTRAMUSCULAR | Status: AC
Start: 2018-01-31 — End: 2018-01-31
  Administered 2018-01-31: 15 mg via INTRAVENOUS
  Filled 2018-01-31: qty 1

## 2018-01-31 MED ORDER — DEXTROSE 5 % FLUSH FOR PUMPS *I*
0.0000 mL/h | INTRAVENOUS | Status: DC | PRN
Start: 2018-01-31 — End: 2018-02-01

## 2018-01-31 MED ORDER — MORPHINE SULFATE 2 MG/ML IV SOLN *WRAPPED*
2.0000 mg | Freq: Once | Status: DC
Start: 2018-01-31 — End: 2018-02-01

## 2018-01-31 NOTE — ED Triage Notes (Signed)
Patient reports Black liquid stools started 0900 this am. Reports 40 BM since 0900. Came in for evaluation . HX possible [place], GERD. Has GI doctor       Triage Note   Florina Ouarriann Nicolo Tomko, RN

## 2018-01-31 NOTE — ED Provider Notes (Signed)
History     Chief Complaint   Patient presents with    Rectal Bleeding     Black Liquid stool     Patient is a 27 year old female who presents to the emergency Department with complaints of diarrhea.  Patient states that she woke up this morning and reports that she's had multiple episodes of "black" diarrhea.  Patient reports mild abdominal discomfort.  Patient does state that she's had worsening as reflux the last 3 days.  Patient denies any vomiting but states she's had nausea today.  Denies any fevers but states that chills.  Patient does state that she had 2 episodes of syncope today.      History provided by:  Patient  Language interpreter used: No        Medical/Surgical/Family History     No past medical history on file.     There is no problem list on file for this patient.           Past Surgical History:   Procedure Laterality Date    CYST REMOVAL      TONSILLECTOMY       No family history on file.       Social History     Tobacco Use    Smoking status: Current Every Day Smoker     Packs/day: 1.00     Years: 4.00     Pack years: 4.00     Types: Cigarettes    Smokeless tobacco: Never Used   Substance Use Topics    Alcohol use: Yes     Comment: occasional    Drug use: No     Living Situation     Questions Responses    Patient lives with     Homeless     Caregiver for other family member     External Services     Employment Employed    Domestic Violence Risk                 Review of Systems   Review of Systems   Constitutional: Positive for chills and fatigue. Negative for activity change, appetite change, diaphoresis, fever and unexpected weight change.   HENT: Negative.    Eyes: Negative for photophobia and visual disturbance.   Respiratory: Negative for apnea, cough, choking, chest tightness, shortness of breath, wheezing and stridor.    Cardiovascular: Negative for chest pain, palpitations and leg swelling.   Gastrointestinal: Positive for diarrhea and nausea. Negative for abdominal  distention, abdominal pain, constipation and vomiting.   Endocrine: Negative.    Genitourinary: Negative for dysuria, flank pain, frequency, hematuria and urgency.   Musculoskeletal: Negative for arthralgias, back pain, gait problem, joint swelling, myalgias, neck pain and neck stiffness.   Skin: Negative for color change, pallor, rash and wound.   Allergic/Immunologic: Negative.    Neurological: Positive for syncope. Negative for dizziness, weakness, light-headedness and headaches.   Hematological: Negative.    Psychiatric/Behavioral: Negative for self-injury and suicidal ideas.   All other systems reviewed and are negative.      Physical Exam     Triage Vitals  Triage Start: Start, (01/31/18 1018)   First Recorded BP: 111/73, Resp: 16, Temp: 36.3 C (97.3 F), Temp src: TEMPORAL Oxygen Therapy SpO2: 97 %, O2 Device: None (Room air), Heart Rate: (!) 130, (01/31/18 1020) Heart Rate (via Pulse Ox): 80, (01/31/18 1123).  First Pain Reported  0-10 Scale: 10, Pain Location/Orientation: Abdomen(Center abd), (01/31/18 1020)       Physical  Exam  Vitals signs and nursing note reviewed.   Constitutional:       General: She is not in acute distress.     Appearance: She is well-developed. She is not diaphoretic.   HENT:      Head: Normocephalic.   Eyes:      Conjunctiva/sclera: Conjunctivae normal.      Pupils: Pupils are equal, round, and reactive to light.   Neck:      Musculoskeletal: Normal range of motion and neck supple.   Cardiovascular:      Rate and Rhythm: Regular rhythm. Tachycardia present.      Heart sounds: Normal heart sounds.   Pulmonary:      Effort: Pulmonary effort is normal. No respiratory distress.      Breath sounds: Normal breath sounds. No wheezing or rales.   Chest:      Chest wall: No tenderness.   Abdominal:      General: Bowel sounds are normal. There is no distension.      Palpations: Abdomen is soft.      Tenderness: There is no tenderness.   Musculoskeletal: Normal range of motion.          General: No tenderness or deformity.   Skin:     General: Skin is warm and dry.      Coloration: Skin is pale.      Findings: No erythema or rash.   Neurological:      Mental Status: She is alert and oriented to person, place, and time.   Psychiatric:         Behavior: Behavior normal.         Thought Content: Thought content normal.         Judgment: Judgment normal.         Medical Decision Making   Patient seen by me on:  01/31/2018    Assessment:  Patient is a 27 year old female who presents to the emergency Department due to concerns for diarrhea and syncope    Differential diagnosis:  Viral gastroenteritis, dehydration, electrolyte abnormalities, PUD, GERD     Plan:  Orders Placed This Encounter      CBC and differential      Basic metabolic panel      RUQ panel (ED only)      Orthostatic blood pressure & heart rate      EKG 12 lead (initial)      Type and screen      Insert peripheral IV    Medications  sodium chloride 0.9 % FLUSH REQUIRED IF PATIENT HAS IV (has no administration in time range)  dextrose 5 % FLUSH REQUIRED IF PATIENT HAS IV (has no administration in time range)  morphine 2 mg/mL injection 2 mg (has no administration in time range)  sodium chloride 0.9 % bolus 1,000 mL (1,000 mLs Intravenous New Bag 01/31/18 1124)  ondansetron (ZOFRAN) injection 4 mg (4 mg Intravenous Given 01/31/18 1126)      Independent review of: Existing labs    ED Course and Disposition:  Labs showed:  Labs Reviewed  CBC AND DIFFERENTIAL - Abnormal; Notable for the following components:     WBC                           13.0 (*)               RDW  11.4 (*)               Neut # K/uL                   11.8 (*)               Lymph # K/uL                  0.4 (*)             All other components within normal limits  RUQ PANEL (ED ONLY) - Abnormal; Notable for the following components:     Globulin                      2.3 (*)             All other components within normal limits  BASIC METABOLIC  PANEL  TYPE AND SCREEN    Patient continues to feel unwell despite medications and IV hydration.  Plan to do CT abdomen and pelvis.  CT pending.  Patient signed out to [place], [place]               Curlene Dolphin, NP          [place], [place], [place]  01/31/18 [phone]

## 2018-01-31 NOTE — ED Notes (Signed)
Attempted to draw blood from pt and she said she felt as if she was about to pass out and had tunnel vision. Released tourniquet unable to get blood but line placed. Began patients bolus and administered nausea meds. Moved patient to a bed and placed on tele for time being. Pt requested we wait for a few minutes to draw the blood so she didn't pass out again. Will continue to monitor.     Raelyn EnsignMegan Kathleen Brinna Divelbiss, RN

## 2018-01-31 NOTE — ED Provider Progress Notes (Addendum)
ED Provider Progress Note    Patient is a sign out from Far[place], NP pending CT abd/pelvis results. Patient is a  27 year old female who presents to the Emergency Department with complaints of acute onset of nausea and episodes of diarrhea.  Patient states this has been an ongoing issue for her over the past year but today seems much worse. States she had several episodes where she passed out while having a bowel movement. Patient states she has been taking Protonix for her GERD but reports this as much worse today and last night. States she has a history of vasovagal syndrome and was recently prescribed Amitriptyline for this but she has not started it. Discussed CT results with patient who states she believes she is still dehydrated due to the amount of diarrhea that she had. States she is still feeling lightheaded when moving and standing. States she is worried that she may pass out again if she goes home. Patient also complaining of a headache at this time. Orthostatic vitals signs ordered again as it appears they were not completed by nursing staff earlier. Additional bolus, Reglan, and Toradol ordered for patient. Discussed observation stay for patient fears of continued syncope. Patient states she would feel more comfortable with this versus being discharged home. Patient accepted by Dr. Merry LoftyFuller [place] for observation admission.         Nestor RampJulianna Gage, NP, 01/31/2018, 10:23 PM     Nestor RampGage, Julianna, NP  01/31/18 2354      After discussion with the patient patient states she would not like to stay and eventually like to go home.  Patient is stable will be discharged home.     Chey Cho, DO  02/01/18 0020

## 2018-01-31 NOTE — ED Notes (Signed)
Pt instructed to give urine sample to r/o pregnancy prior to CT.

## 2018-02-01 ENCOUNTER — Encounter: Payer: Self-pay | Admitting: Emergency Medicine

## 2018-02-01 DIAGNOSIS — G479 Sleep disorder, unspecified: Secondary | ICD-10-CM | POA: Insufficient documentation

## 2018-02-01 NOTE — Discharge Instructions (Signed)
As discussed, your labs and exam were reassuring. You should follow up with your primary care provider as soon as possible to discuss your ongoing symptoms. You were provided with a prescription for Zofran to take as needed for nausea. If you develop recurrent syncopal episodes, gait instability, worsening nausea and vomiting- please return to the Emergency Department for evaluation.

## 2018-02-01 NOTE — ED Notes (Signed)
Patient discharged to home. Reviewed AVS and discharge instructions. All belongings with. Patient has no additional complaints and is ambulatory at dc. Joan Avetisyan J Connar Keating, RN

## 2018-02-02 LAB — EKG 12-LEAD
P: 35 deg
PR: 106 ms
QRS: 88 deg
QRSD: 84 ms
QT: 361 ms
QTc: 419 ms
Rate: 81 {beats}/min
T: 90 deg

## 2018-02-03 ENCOUNTER — Other Ambulatory Visit: Payer: Self-pay | Admitting: Gastroenterology

## 2018-02-14 ENCOUNTER — Other Ambulatory Visit
Admission: RE | Admit: 2018-02-14 | Discharge: 2018-02-14 | Disposition: A | Discharge status: Home / Self Care | Payer: Medicaid Other | Source: Ambulatory Visit | Attending: Internal Medicine | Admitting: Internal Medicine

## 2018-02-14 DIAGNOSIS — K1379 Other lesions of oral mucosa: Secondary | ICD-10-CM | POA: Insufficient documentation

## 2018-02-14 DIAGNOSIS — R110 Nausea: Secondary | ICD-10-CM | POA: Insufficient documentation

## 2018-02-14 LAB — VITAMIN B12: Vitamin B12: 359 pg/mL (ref 232–1245)

## 2018-02-14 LAB — FOLATE: Folate: >14 ng/mL (ref >=4.6)

## 2018-02-17 LAB — VITAMIN B6: Vitamin B6: 20.8 nmol/L (ref 20.0–125.0)

## 2018-02-18 LAB — VITAMIN B1: Vitamin B1: 97 nmol/L (ref 70–180)

## 2018-02-24 ENCOUNTER — Other Ambulatory Visit
Admission: RE | Admit: 2018-02-24 | Discharge: 2018-02-24 | Disposition: A | Discharge status: Home / Self Care | Payer: Medicaid Other | Source: Ambulatory Visit | Attending: Internal Medicine | Admitting: Internal Medicine

## 2018-02-24 ENCOUNTER — Other Ambulatory Visit: Payer: Self-pay | Admitting: Gastroenterology

## 2018-02-24 DIAGNOSIS — R5383 Other fatigue: Secondary | ICD-10-CM | POA: Insufficient documentation

## 2018-02-24 DIAGNOSIS — R634 Abnormal weight loss: Secondary | ICD-10-CM | POA: Insufficient documentation

## 2018-02-24 LAB — T4, FREE: Free T4: 1.1 ng/dL (ref 0.9–1.7)

## 2018-02-24 LAB — TSH: TSH: 1.28 u[IU]/mL (ref 0.27–4.20)

## 2018-02-25 LAB — UNMAPPED LAB RESULTS
Basophil # (HT): 0 10 3/uL — NL (ref 0.0–0.2)
Basophil % (HT): 0 % — NL (ref 0–2)
Eosinophil # (HT): 0 10 3/uL — NL (ref 0.0–0.5)
Eosinophil % (HT): 0 % — NL (ref 0–7)
Hematocrit (HT): 34 % — ABNORMAL LOW (ref 40–52)
Hemoglobin (HGB) (HT): 12 g/dL — NL (ref 11.5–16.0)
Lymphocyte # (HT): 2.1 10 3/uL — NL (ref 0.9–3.8)
Lymphocyte % (HT): 42 % — NL (ref 17–44)
MCHC (HT): 35.4 g/dL — NL (ref 32.0–36.0)
MCV (HT): 88 fL — NL (ref 81–99)
Mean Corpuscular Hemoglobin (MCH) (HT): 31.2 pg — NL (ref 26.0–34.0)
Monocyte # (HT): 0.4 10 3/uL — NL (ref 0.2–1.0)
Monocyte % (HT): 8 % — NL (ref 4–15)
Neutrophil # (HT): 2.5 10 3/uL — NL (ref 1.5–7.7)
Platelets (HT): 188 10 3/uL — NL (ref 140–400)
RBC (HT): 3.85 10 6/uL — NL (ref 3.80–5.20)
RDW (HT): 11.6 % — NL (ref 11.5–15.0)
Seg Neut % (HT): 49 % — NL (ref 43–75)
WBC (HT): 5.1 10 3/uL — NL (ref 4.0–10.0)

## 2018-02-26 ENCOUNTER — Encounter: Payer: Self-pay | Admitting: Emergency Medicine

## 2018-02-26 ENCOUNTER — Emergency Department
Admission: EM | Admit: 2018-02-26 | Discharge: 2018-02-26 | Disposition: A | Discharge status: Home / Self Care | Payer: Medicaid Other | Source: Ambulatory Visit | Attending: Emergency Medicine | Admitting: Emergency Medicine

## 2018-02-26 ENCOUNTER — Other Ambulatory Visit: Payer: Self-pay | Admitting: Cardiology

## 2018-02-26 DIAGNOSIS — R109 Unspecified abdominal pain: Secondary | ICD-10-CM | POA: Insufficient documentation

## 2018-02-26 DIAGNOSIS — R110 Nausea: Secondary | ICD-10-CM | POA: Insufficient documentation

## 2018-02-26 DIAGNOSIS — R1084 Generalized abdominal pain: Secondary | ICD-10-CM

## 2018-02-26 DIAGNOSIS — R6883 Chills (without fever): Secondary | ICD-10-CM

## 2018-02-26 DIAGNOSIS — R197 Diarrhea, unspecified: Secondary | ICD-10-CM | POA: Insufficient documentation

## 2018-02-26 DIAGNOSIS — I499 Cardiac arrhythmia, unspecified: Secondary | ICD-10-CM

## 2018-02-26 HISTORY — DX: Gastro-esophageal reflux disease without esophagitis: K21.9

## 2018-02-26 LAB — HOLD BLUE

## 2018-02-26 LAB — CBC AND DIFFERENTIAL
Baso # K/uL: 0 10*3/uL (ref 0.0–0.1)
Basophil %: 0.4 %
Eos # K/uL: 0 10*3/uL (ref 0.0–0.4)
Eosinophil %: 0.4 %
Hematocrit: 39 % (ref 34–45)
Hemoglobin: 13.6 g/dL (ref 11.2–15.7)
IMM Granulocytes #: 0 10*3/uL
IMM Granulocytes: 0.2 %
Lymph # K/uL: 2.1 10*3/uL (ref 1.2–3.7)
Lymphocyte %: 42.9 %
MCH: 32 pg/cell (ref 26–32)
MCHC: 35 g/dL (ref 32–36)
MCV: 90 fL (ref 79–95)
Mono # K/uL: 0.5 10*3/uL (ref 0.2–0.9)
Monocyte %: 9.5 %
Neut # K/uL: 2.3 10*3/uL (ref 1.6–6.1)
Nucl RBC # K/uL: 0 10*3/uL (ref 0.0–0.0)
Nucl RBC %: 0 /100 WBC (ref 0.0–0.2)
Platelets: 203 10*3/uL (ref 160–370)
RBC: 4.3 MIL/uL (ref 3.9–5.2)
RDW: 11.4 % — ABNORMAL LOW (ref 11.7–14.4)
Seg Neut %: 46.6 %
WBC: 4.8 10*3/uL (ref 4.0–10.0)

## 2018-02-26 LAB — RUQ PANEL (ED ONLY)
ALT: 19 U/L (ref 0–35)
Albumin: 5.1 g/dL (ref 3.5–5.2)
Alk Phos: 44 U/L (ref 35–105)
Amylase: 56 U/L (ref 28–100)
Bilirubin,Direct: <0.2 mg/dL (ref 0.0–0.3)
Bilirubin,Total: 0.6 mg/dL (ref 0.0–1.2)
Lipase: 42 U/L (ref 13–60)
Total Protein: 7.4 g/dL (ref 6.3–7.7)

## 2018-02-26 LAB — PREGNANCY TEST, SERUM: Preg,Serum: NEGATIVE

## 2018-02-26 LAB — PLASMA PROF 7 (ED ONLY)
Anion Gap,PL: 14 (ref 7–16)
CO2,Plasma: 26 mmol/L (ref 20–28)
Chloride,Plasma: 99 mmol/L (ref 96–108)
Creatinine: 0.78 mg/dL (ref 0.51–0.95)
GFR,Black: 121 *
GFR,Caucasian: 105 *
Glucose,Plasma: 86 mg/dL (ref 60–99)
Potassium,Plasma: 3.8 mmol/L (ref 3.4–4.7)
Sodium,Plasma: 139 mmol/L (ref 133–145)
UN,Plasma: 10 mg/dL (ref 6–20)

## 2018-02-26 MED ORDER — SODIUM CHLORIDE 0.9 % IV BOLUS *I*
1000.0000 mL | Freq: Once | Status: AC
Start: 2018-02-26 — End: 2018-02-26
  Administered 2018-02-26: 1000 mL via INTRAVENOUS

## 2018-02-26 MED ORDER — ONDANSETRON HCL 2 MG/ML IV SOLN *I*
4.0000 mg | Freq: Once | INTRAMUSCULAR | Status: AC
Start: 2018-02-26 — End: 2018-02-26
  Administered 2018-02-26: 4 mg via INTRAVENOUS
  Filled 2018-02-26: qty 2

## 2018-02-26 MED ORDER — FAMOTIDINE (PF) 20 MG/2ML IV SOLN *I*
20.0000 mg | Freq: Once | INTRAVENOUS | Status: AC
Start: 2018-02-26 — End: 2018-02-26
  Administered 2018-02-26: 20 mg via INTRAVENOUS
  Filled 2018-02-26: qty 2

## 2018-02-26 NOTE — ED Provider Notes (Signed)
History     Chief Complaint   Patient presents with    Illness     Leonette Nuttinglexis Boonstra is a 27 year old female with PMHx GERD who presents with c/o abdominal pain, nausea. She has had intermittent pain for almost a year. She describes cramping that is diffuse, "burning" pain primarily in epigastric region. Patient is followed by PCP, GI. She has had a reportedly normal endoscopy, CT and labs. She reports persistent nausea. No vomiting. No known fever but c/o chills. She has had intermittent diarrhea, none presently. No rectal bleeding. She reports that she is intermittently lightheaded and has actually passed out on several occasions. [facility]. She was prescribed zofran and bentyl however has not picked them up. She was last seen by PCP 1 week ago and continues to take protonix, carafate prn. Patient has follow up with her GI doctor on January 6th.          Medical/Surgical/Family History     No past medical history on file.     Patient Active Problem List   Diagnosis Code    Syncope, unspecified syncope type R55            Past Surgical History:   Procedure Laterality Date    CYST REMOVAL      TONSILLECTOMY       No family history on file.       Social History     Tobacco Use    Smoking status: Current Every Day Smoker     Packs/day: 1.00     Years: 4.00     Pack years: 4.00     Types: Cigarettes    Smokeless tobacco: Never Used   Substance Use Topics    Alcohol use: Yes     Comment: occasional    Drug use: No     Living Situation     Questions Responses    Patient lives with     Homeless     Caregiver for other family member     External Services     Employment Employed    Domestic Violence Risk                 Review of Systems   Review of Systems   Constitutional: Positive for chills. Negative for fever.   Gastrointestinal: Positive for abdominal pain and nausea. Negative for blood in stool, constipation and vomiting.   Genitourinary: Negative for difficulty urinating and  dysuria.   Musculoskeletal: Negative for back pain and neck pain.   Skin: Negative for pallor.   Allergic/Immunologic: Negative for immunocompromised state.   Neurological: Negative for dizziness and light-headedness.   Psychiatric/Behavioral: Negative for confusion.       Physical Exam     Triage Vitals  Triage Start: Start, (02/26/18 0259)   First Recorded BP: 122/90, Resp: 16, Temp: 36.2 C (97.2 F), Temp src: TEMPORAL Oxygen Therapy SpO2: 97 %, O2 Device: None (Room air), Heart Rate: 86, (02/26/18 0300)  .  First Pain Reported  0-10 Scale: 0, (02/26/18 0300)       Physical Exam  Vitals signs and nursing note reviewed.   Constitutional:       General: She is not in acute distress.     Appearance: She is not ill-appearing or toxic-appearing.   Neck:      Musculoskeletal: Normal range of motion and neck supple.   Cardiovascular:      Rate and Rhythm: Normal rate and  regular rhythm.      Heart sounds: Normal heart sounds.   Pulmonary:      Effort: Pulmonary effort is normal.      Breath sounds: Normal breath sounds.   Abdominal:      General: Bowel sounds are normal. There is no distension.      Palpations: Abdomen is soft.      Tenderness: There is abdominal tenderness (mild generalized). There is no guarding or rebound.   Musculoskeletal: Normal range of motion.   Skin:     General: Skin is warm and dry.      Coloration: Skin is not pale.   Neurological:      General: No focal deficit present.      Mental Status: She is alert and oriented to person, place, and time.         Medical Decision Making   Patient seen by me on:  02/26/2018    Assessment:  Leonette Nuttinglexis Sterba is a 27 year old female with PMHx GERD who presents with c/o abdominal pain, nausea, intermittent diarrhea over the past year. She is non-toxic, afebrile with stable vital signs in ED. Her abdominal exam is benign.    Differential diagnosis:  GERD, gastritis, PUD, IBS, pregnancy, dehydration, electrolyte abnormality, metabolic derangement    Plan:       Labs including CBC, ED7, RUQ panel, pregnancy  IV zofran, pepcid, fluids  Further work up, disposition pending results and clinical course    EKG Interpretation: normal sinus rhythm    ED Course and Disposition:      Update 8:57 AM    Labs are unremarkable. Patient has tolerated PO in the ED. Will d/c encouraging GI follow up and with return precautions.            [address] CourtMelinda F Sherol Sabas, PA          Lyda KalataMetcalfe, LoraineMelinda F, [place]  02/26/18 [phone]

## 2018-02-26 NOTE — ED Triage Notes (Signed)
Flu-like symptoms, feels "faint" x 1 month. Seen at other hospitals without answers. +nausea, deneis V/D. Poor appetite. EKG in triage.        Triage Note   Twana FirstMolly D Cybill Uriegas, RN

## 2018-02-26 NOTE — Discharge Instructions (Signed)
Your exam and labs are reassuring today.    Please resume routine medications and begin taking zofran and bentyl as prescribed.    Follow up with your GI doctor for ongoing evaluation and management of your abdominal pain and nausea.    Return to ED for fever, vomiting, new or worsening pain, dizziness, or for any emergent concern.

## 2018-02-27 LAB — EKG 12-LEAD
P: 81 deg
PR: 141 ms
QRS: 92 deg
QRSD: 94 ms
QT: 405 ms
QTc: 444 ms
Rate: 72 {beats}/min
T: 107 deg

## 2018-03-08 ENCOUNTER — Other Ambulatory Visit: Payer: Self-pay | Admitting: Gastroenterology

## 2018-03-13 ENCOUNTER — Encounter: Payer: Self-pay | Admitting: Gastroenterology

## 2018-03-15 ENCOUNTER — Encounter: Payer: Self-pay | Admitting: Gastroenterology

## 2018-04-20 ENCOUNTER — Other Ambulatory Visit: Payer: Self-pay | Admitting: Gastroenterology

## 2018-04-28 ENCOUNTER — Encounter: Payer: Self-pay | Admitting: Gastroenterology

## 2018-09-27 DIAGNOSIS — R636 Underweight: Secondary | ICD-10-CM | POA: Insufficient documentation

## 2018-10-23 ENCOUNTER — Ambulatory Visit: Payer: Medicaid Other | Admitting: Registered"

## 2018-10-26 ENCOUNTER — Ambulatory Visit: Payer: Medicaid Other | Admitting: Registered"

## 2018-10-26 VITALS — Wt 95.0 lb

## 2018-10-26 DIAGNOSIS — Z713 Dietary counseling and surveillance: Secondary | ICD-10-CM

## 2018-10-26 NOTE — Progress Notes (Signed)
Nutrition Assessment:   Darlene Murphy is a 28 y.o. female who presents today for an initial visit, via Telehealth d/t COVID-19 outbreak concerns, for medical nutrition therapy in setting of prolonged GI distress and PMH (per GI note) of cholecystectomy, IBS, and depression.     Body mass index is 15.33 kg/m.  Vitals:    10/26/18 1549   Weight: (!) 43.1 kg (95 lb)       Video Visit     Location of Patient: home    Location of Telemedicine Provider: home office    Other participants in telemedicine encounter and roles:  none    This is a new patient visit.    Reason for visit: Nutrition Counseling      HPI  Pt reports to clinic for medical nutrition therapy in setting of prolonged GI distress and PMH (per GI note) of cholecystectomy, IBS, and depression.       Patient's problem list, allergies, and medications were reviewed and updated as appropriate.  Please see the EHR for full details.    Assessment & Plan:  See below    Consent was obtained from the patient to complete this video visit; including the potential for financial liability.          [name], RD    Lab Results:   02/26/18 (Prior to cholecystectomy in Feb. 2020)  AMYLASE=56 N  LIPASE=42 N    02/24/18  TSH=1.28 N  T4=1.1 N      02/14/18  VIT B12=359 N TO L  VITAMIN B6=20.8 N TO L  VITAMIN B1=97 N  FOLATE=>14.0 N      EGD and colonoscopy were normal per pt and GI note      Personal Hx:     Unknown    Physical Activity:   Unknown      Food and Nutrition Hx:   Drinks alcohol: 0 times per week  Drinks soda: 0 times per week    Pt presented to appt extremely anxious and frustrated about ongoing GI problems. Pt reports digestive issues started in summer 2018, with bouts of diarrhea, pain, burning, cramping and lasted about 1 year. Pt reports in December she had blinding diarrhea and passed out eventually which led her to the hospital. Pt reports then on the last week of February she had her gallbladder removed and symptoms subsided somewhat temporarily. Pt  reports her mom had bad gallstones after childbirth and a cholecystectomy as well.     Pt reports she still wakes every morning with burning abdomen and stomach pain. Pt reports as a result of all GI distress she has lost 20 lbs unintentionally.     Pt reports s/p gallbladder surgery she was told to eat a zero fat diet (note says low fat?) and so she tried to avoid fat, and acidic foods d/t GERD. Pt reports she was just eating brown rice, toast, oatmeal and was still sick.    Pt reports she's had reflux since she was a kid.    Writer notes normal EGD/colonoscopy , only h pylori negative in stool study. Writer would suggest assessing stool for other parasitic or bacterial factors as well (such as giardia, c diff, salmonella, etc as part of general bowel protocol) if team agrees.     Family hx: CVD, mom gallbladder removed, dad cancers on his side, cousins has crohns    Pt reports a lot of fruit causes itching and swelling in mouth. 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 Pt reports she had an appt with allergist but it was cancelled d/t her surgery. Pt advised to reschedule with allergist to address these issues as desired.     Pt reports BMs not regular-a week of normal, then bout of diarrhea, then bout of constipation.      Pt reports taking a woman's daily MVI gummy. Pt reports last fall when she saw she was low in B6, she tried taking a supplement of it at 50 mg and got shaky, frequent urination, jittery and tired, so stopped her supplements. Writer notes B6 toxicity can happen from supplementation of 100 mg or more per day.      D/t unintended wt loss, and limited diet, writer suggests reassessing the following labs s/p cholecystectomy to best align her nutritional care:  lipids/a1c/vitamin d/CMP/FERRITIN/TIBC/B12/CBC/B6.Marland Kitchen.     Pt reports in December she quit coffee, drinking alcohol and smoking. Smokes occasionally now.     Does  not tolerate coffee-belly pain.     Pt reports she wakes up in morning and feels like her throat is bleeding. Was taking pepcid and had swallowing issue d/t reflux. Took it for 3 days-throat sensation abated.  But then brought more reflux so stopped.     Not drinking enough water. Feels dehydrated all the time. Pt reports she drinks about 1/2 pedialyte per day. Gluten free/lactose free nutrition supplements discussed.     Pt educated and advised to increase calorie consumption with low FODMAP diet provided more "safe" foods.       24-Hour Dietary Recall:   Breakfast: Oatmeal or sourdough bread water or yogurt, blueberries, honey, granola  Snack: sometimes veggie wrap with 2 cherry tomatoes, cucumbers, pickles, hummus, cheddar cheese  Lunch: salad sometimes or egg sandwich.   Dinner: rice and beans, started regular pasta      Energy Balance:          Nutrition Diagnosis:   Altered GI function as related to cholecystecomy as evidenced by GI distress, unintended weight loss and the need for nutrition counseling.       Nutrition Interventions:   1) Follow a low FODMAP diet for 4 weeks.  2) Strive for small frequent meals to reduce pressure on LES and minimize reflux while promoting wt gain.  3) Aim for 64 oz of water per day.  4) Strive for 25-30 g fiber per day from low FODMAP list.   5) Consider adding an Orgain nutrition supplement until GI distress improves.  6) Get new blood work if ordered by PCP.       Materials Provided:   Low FODMAP MNT      Follow-Up Plan for Monitoring and Evaluation:   [name] will follow-up in 2 weeks by telehealth.    Writer spent 60 minutes with the patient.

## 2018-11-08 ENCOUNTER — Other Ambulatory Visit: Payer: Self-pay | Admitting: Gastroenterology

## 2018-11-10 ENCOUNTER — Ambulatory Visit: Payer: Medicaid Other | Admitting: Registered"

## 2018-11-10 DIAGNOSIS — Z713 Dietary counseling and surveillance: Secondary | ICD-10-CM

## 2018-11-10 NOTE — Progress Notes (Signed)
Nutrition Assessment:   Darlene Murphy is a 28 y.o. female who presents today for a follow-up visit, via Telehealth d/t COVID-19 outbreak concerns, for medical nutrition therapy in setting of prolonged GI distress and PMH (per GI note) of cholecystectomy, IBS, and depression.     There is no height or weight on file to calculate BMI.  There were no vitals filed for this visit. deferred    Video Visit     Location of Patient: home    Location of Telemedicine Provider: home office    Other participants in telemedicine encounter and roles:  none    This is an established patient visit.    Reason for visit: Nutrition Counseling      HPI  Pt reports to clinic for medical nutrition therapy in setting of prolonged GI distress and PMH (per GI note) of cholecystectomy, IBS, and depression.       Patient's problem list, allergies, and medications were reviewed and updated as appropriate.  Please see the EHR for full details.    Assessment & Plan:      Pt reports she started low FODMAP on 9/1 first 2 days felt great, then got really bad burning in lower abdomen as before but gas is gone. Pt reports she is still having this acute symptom. Pt also reports rash-around neck, dry skin in places and irregular periods.     Pt advised to d/c low FODMAP diet for now until etiology for acute GI distress is determined. Referred to PCP/GI for dx. Writer will support. SIBO? Stool testing for parasites/bacteria? Endometriosis? Writer defers to [employer] and guidance.     Pt reports she is still smoking and doesn't want to quit, but that she has stopped this and coffee for past two weeks d/t pain.     Recall:     B: GF oatmeal sometimes with brown sugar, choco chimp with rice milk, lactose yogurt (almond milk), chia seeds, blueberries, sometimes ginger tea or water  L: [place] cheddar sandwich with mayo or mustard or tuna with mayo with butter lettuce or fody caesar dressing, sweet potatoes  D: white rice with green beans and fody  teryaki sauce, or egg sandwiches   S: rice cakes, or GF short bread cookies    Pt reports intense somach rumbling and rectal rumbling with increased urgency and normal BM.     Pt reports she is also seeing trails in darkness with her vision.      Writer notes pt was borderline B12 deficient in 12/19 when she reports taking organic [employer]mans multivitamin gummies daily. They are gf and vegan and she's been taking them for a year so they did not help her B12 levels in December. Until pt can get new blood work, Probation officer suggests pt supplement Sublingual B12-100-500 mcg per week to prevent deficiency if team agrees.        Consent was obtained from the patient to complete this video visit; including the potential for financial liability.          [name], RD    Lab Results:   02/26/18 (Prior to cholecystectomy in Feb. 2020)  AMYLASE=56 N  LIPASE=42 N    02/24/18  TSH=1.28 N  T4=1.1 N    02/14/18  VIT B12=359 N TO L  VITAMIN B6=20.8 N TO L  VITAMIN B1=97 N  FOLATE=>14.0 N      EGD and colonoscopy were normal per pt and GI note    Energy Balance:  Nutrition Diagnosis:   Altered GI function as related to cholecystecomy as evidenced by GI distress, unintended weight loss and the need for nutrition counseling.       Nutrition Interventions:   1) Contact PCP and GI re acute GI symptoms and d/c low FODMAP for now.   2) Strive for small frequent meals to reduce pressure on LES and minimize reflux while promoting wt gain.  3) Aim for 64 oz of water per day.  4) Strive for 25-30 g fiber per day as able.    5) Consider adding an Orgain nutrition supplement until GI distress improves.  6) Get new blood work if ordered by PCP.       Materials Provided:         Follow-Up Plan for Monitoring and Evaluation:   [name] will follow-up in 2 weeks by telehealth.    Writer spent 30 minutes with the patient.

## 2018-11-24 ENCOUNTER — Encounter: Payer: Self-pay | Admitting: Registered"

## 2018-11-24 ENCOUNTER — Ambulatory Visit: Payer: Medicaid Other | Admitting: Registered"

## 2018-11-24 DIAGNOSIS — Z713 Dietary counseling and surveillance: Secondary | ICD-10-CM

## 2018-11-24 NOTE — Progress Notes (Signed)
Nutrition Assessment:   Darlene Murphy is a 28 y.o. female who presents today for a follow-up visit, via Telehealth d/t COVID-19 outbreak concerns, for medical nutrition therapy in setting of prolonged GI distress and PMH (per GI note) of cholecystectomy, IBS, and depression.     There is no height or weight on file to calculate BMI.  There were no vitals filed for this visit. deferred    Video Visit     Location of Patient: home    Location of Telemedicine Provider: home office    Other participants in telemedicine encounter and roles:  none    This is an established patient visit.    Reason for visit: No chief complaint on file.      HPI  Pt reports to clinic for medical nutrition therapy in setting of prolonged GI distress and PMH (per GI note) of cholecystectomy, IBS, and depression.       Patient's problem list, allergies, and medications were reviewed and updated as appropriate.  Please see the EHR for full details.    Assessment & Plan:      Pt reports to clinic in an anxious state. Pt reports she's had another good week GI wise followed by a bad one where she has diarrhea 4x/day sometimes. Pt reports she is very concerned about her health and weight loss. Pt says she lost another 5 lbs unintentionally and wants to see a GI doctor for assessment.     New colonoscopy needed with these drastic bowel changes since cholecystectomy? Pt reports low FODMAP didn't work so Probation officer advised her to d/c it for now. Pt says she is following it until she can find something better that helps.    Pt referred to PCP/GI/ED for acute problems. Writer contacted GI team at [facility] to request MD appt on her behalf.       Consent was obtained from the patient to complete this video visit; including the potential for financial liability.          [name], RD    Lab Results:   02/26/18 (Prior to cholecystectomy in Feb. 2020)  AMYLASE=56 N  LIPASE=42 N    02/24/18  TSH=1.28 N  T4=1.1 N    02/14/18  VIT B12=359 N TO L  VITAMIN B6=20.8 N  TO L  VITAMIN B1=97 N  FOLATE=>14.0 N      EGD and colonoscopy were normal per pt and GI note    Energy Balance:          Nutrition Diagnosis:   Altered GI function as related to cholecystecomy as evidenced by GI distress, unintended weight loss and the need for nutrition counseling.       Nutrition Interventions:   1) Contact PCP and GI re acute GI symptoms and d/c low FODMAP for now.   2) Strive for small frequent meals to reduce pressure on LES and minimize reflux while promoting wt gain.  3) Aim for 64 oz of water per day.  4) Strive for 25-30 g fiber per day as able.    5) Consider adding an Or[employer] nutrition supplement until GI distress improves.  6) Get new blood work if ordered by PCP.       Materials Provided:         Follow-Up Plan for Monitoring and Evaluation:   Darlene Murphy will follow-up in 2 weeks by telehealth.    Writer spent 15 minutes with the patient.

## 2019-04-23 LAB — UNMAPPED LAB RESULTS
Hematocrit (HT): 42 % — NL (ref 34–47)
Hemoglobin (HGB) (HT): 14.5 g/dL — NL (ref 11.5–16.0)
MCHC (HT): 34.7 g/dL — NL (ref 32.0–36.0)
MCV (HT): 91.9 fL — NL (ref 81.0–99.0)
Mean Corpuscular Hemoglobin (MCH) (HT): 31.9 pg — NL (ref 26.0–34.0)
Platelets (HT): 206 10 3/uL — NL (ref 140–400)
RBC (HT): 4.55 10 6/uL — NL (ref 3.80–5.20)
RDW (HT): 11.8 % — NL (ref 11.5–15.0)
WBC (HT): 4.4 10 3/uL — NL (ref 4.0–10.8)

## 2019-05-04 ENCOUNTER — Encounter: Payer: Self-pay | Admitting: Gastroenterology

## 2019-07-13 ENCOUNTER — Other Ambulatory Visit: Payer: Self-pay | Admitting: Gastroenterology

## 2019-07-20 ENCOUNTER — Other Ambulatory Visit
Admission: RE | Admit: 2019-07-20 | Discharge: 2019-07-20 | Disposition: A | Discharge status: Home / Self Care | Payer: Medicaid Other | Source: Ambulatory Visit | Attending: Internal Medicine | Admitting: Internal Medicine

## 2019-07-20 DIAGNOSIS — Z0000 Encounter for general adult medical examination without abnormal findings: Secondary | ICD-10-CM | POA: Insufficient documentation

## 2019-07-20 LAB — URINALYSIS WITH REFLEX TO MICROSCOPIC
Blood,UA: NEGATIVE
Glucose,UA: NEGATIVE mg/dL
Ketones, UA: NEGATIVE
Leuk Esterase,UA: NEGATIVE
Nitrite,UA: NEGATIVE
Protein,UA: NEGATIVE mg/dL
Specific Gravity,UA: 1.007 (ref 1.002–1.030)
pH,UA: 6.5 (ref 5.0–8.0)

## 2019-07-20 LAB — CBC
Hematocrit: 41 % (ref 34–45)
Hemoglobin: 13.6 g/dL (ref 11.2–15.7)
MCH: 30 pg (ref 26–32)
MCHC: 33 g/dL (ref 32–36)
MCV: 91 fL (ref 79–95)
Platelets: 216 10*3/uL (ref 160–370)
RBC: 4.5 MIL/uL (ref 3.9–5.2)
RDW: 11.5 % — ABNORMAL LOW (ref 11.7–14.4)
WBC: 3.6 10*3/uL — ABNORMAL LOW (ref 4.0–10.0)

## 2019-07-20 LAB — LIPID PANEL
Chol/HDL Ratio: 1.8
Cholesterol: 139 mg/dL
HDL: 77 mg/dL — ABNORMAL HIGH (ref 40–60)
LDL Calculated: 53 mg/dL
Non HDL Cholesterol: 62 mg/dL
Triglycerides: 47 mg/dL

## 2019-07-20 LAB — FOLATE: Folate: >14 ng/mL (ref >=4.6)

## 2019-07-20 LAB — T4, FREE: Free T4: 1.2 ng/dL (ref 0.9–1.7)

## 2019-07-22 LAB — HEMOGLOBIN A1C: Hemoglobin A1C: 4.8 %

## 2019-07-26 ENCOUNTER — Encounter: Payer: Self-pay | Admitting: Thoracic/Foregut Surgery

## 2019-08-17 ENCOUNTER — Telehealth: Payer: Self-pay | Admitting: Thoracic/Foregut Surgery

## 2019-08-17 ENCOUNTER — Other Ambulatory Visit: Payer: Self-pay | Admitting: Thoracic Surgery

## 2019-08-17 DIAGNOSIS — K219 Gastro-esophageal reflux disease without esophagitis: Secondary | ICD-10-CM

## 2019-08-17 NOTE — Telephone Encounter (Signed)
Left message review imaging appointment prior to NPV and call to confirm or change.   Added esophagram prior to NPV per Dr. Charleston Poot review of the visit. The mom had called to confirm the appointment which she was aware of is appropriate for bile dx . Test ordered upon review. Informed parent the patient had to call to add her on to the ambulatory contact list for any further questions.

## 2019-09-18 ENCOUNTER — Ambulatory Visit
Admission: RE | Admit: 2019-09-18 | Discharge: 2019-09-18 | Disposition: A | Discharge status: Home / Self Care | Payer: Medicaid Other | Source: Ambulatory Visit | Attending: Thoracic Surgery | Admitting: Thoracic Surgery

## 2019-09-18 ENCOUNTER — Other Ambulatory Visit: Payer: Medicaid Other

## 2019-09-18 DIAGNOSIS — K219 Gastro-esophageal reflux disease without esophagitis: Secondary | ICD-10-CM | POA: Insufficient documentation

## 2019-09-24 ENCOUNTER — Encounter: Payer: Self-pay | Admitting: Thoracic/Foregut Surgery

## 2019-09-24 ENCOUNTER — Ambulatory Visit: Payer: Medicaid Other | Admitting: Thoracic/Foregut Surgery

## 2019-09-24 VITALS — BP 101/63 | HR 78 | Temp 98.1°F | Resp 16 | Ht 66.0 in | Wt 100.3 lb

## 2019-09-24 DIAGNOSIS — R634 Abnormal weight loss: Secondary | ICD-10-CM

## 2019-09-24 DIAGNOSIS — R197 Diarrhea, unspecified: Secondary | ICD-10-CM

## 2019-09-24 DIAGNOSIS — R109 Unspecified abdominal pain: Secondary | ICD-10-CM

## 2019-09-24 NOTE — Preop H&P (Signed)
09/24/2019      RE: Darlene Murphy  DOB: Jun 05, 1990  MRN: [id number]      Dear Darlene Murphy,        Today, I had the pleasure of meeting Darlene Murphy in the Thoracic and Foregut surgery clinic.     As you are aware,  Darlene Murphy is a 29 y.o. female with no significant history who developed LUQ pain about 2 years ago. Work up included CT abdomen/pelvis, EGD, colonoscopy which were negative. Darlene Murphy had an [place] of abdomen which was negative for gallstones, however, Darlene Murphy had reproduction of symptoms with CCK provocation. Subsequently Darlene Murphy underwent cholecystectomy on 04/29/19 for biliary dyskinesia with Dr. Radene Knee without relief of Darlene Murphy symptoms.     Darlene Murphy has seen several gastroenterology specialists, and is currently following with Dr. Einar Gip. Darlene Murphy most recent endoscopy was on 05/04/19. This showed mild diffuse gastritis and bile throughout the stomach. Darlene Murphy was diagnosed with bile-acid reflux and was started on cholestyramine. Darlene Murphy has tried PPIs with Carafate with minimal effect. Darlene Murphy pain at times is so severe that Darlene Murphy passes out. Darlene Murphy has met with Dr. Cathren Laine of Rheumatology for positive  ANA but no rheumotologic explanation has been found. Has a follow-up scheduled later this week.    Of note, patient has a very extensive family history of crohn's disease on paternal side.     Today Darlene Murphy presents with Darlene Murphy mother for an initial visit in Thoracic/foregut Surgery clinic. States he has "good and bad weeks". Darlene Murphy endorses LUQ pain, 25 pound weight loss since December of 2019, nausea, diarrhea which alternates with constipation. Darlene Murphy has food aversion and Darlene Murphy BMI is 16.     Past Medical History:   Diagnosis Date    GERD (gastroesophageal reflux disease)          Past Surgical History:   Procedure Laterality Date    CHOLECYSTECTOMY      CYST REMOVAL      TONSILLECTOMY           Current Outpatient Medications   Medication    cholestyramine (QUESTRAN) 4 g packet    sucralfate (CARAFATE) 1 GM tablet    SERTRALINE HCL PO    pantoprazole (PROTONIX) 40 MG EC  tablet     No current facility-administered medications for this visit.       Allergies   Allergen Reactions    Azithromycin Rash         Family History   Problem Relation Age of Onset    Depression Mother     Arthritis Mother     Cancer Father     Heart Disease Father        Social History     Socioeconomic History    Marital status: Single     Spouse name: Not on file    Number of children: Not on file    Years of education: Not on file    Highest education level: Not on file   Tobacco Use    Smoking status: Current Every Day Smoker     Packs/day: 0.50     Years: 11.00     Pack years: 5.50     Types: Cigarettes    Smokeless tobacco: Never Used   Substance and Sexual Activity    Alcohol use: Not Currently    Drug use: No    Sexual activity: Not Currently   Other Topics Concern    Not on file   Social History Narrative    Not on file  Review of Systems   Constitutional: Positive for malaise/fatigue and weight loss. Negative for chills and fever.   Respiratory: Negative for cough, hemoptysis and shortness of breath.    Cardiovascular: Negative for chest pain.   Gastrointestinal: Positive for abdominal pain, constipation, diarrhea, heartburn and nausea.   Neurological: Positive for dizziness and loss of consciousness.       PHYSICAL EXAM:   height is 1.676 m (_0 ) and weight is 45.5 kg (100 lb 4.8 oz). Darlene Murphy temperature is 36.7 C (98.1 F). Darlene Murphy blood pressure is 101/63 and Darlene Murphy pulse is 78. Darlene Murphy respiration is 16 and oxygen saturation is 97%.   Pain Score:   0 - No pain    Darlene Murphy is alert and oriented. Darlene Murphy has no palpable cervical or supraclavicular lymphadenopathy.  Darlene Murphy lungs are clear to auscultation bilaterally, without any rhonchi, rales, or wheezes.  Darlene Murphy heart rate and rhythm are regular, without any murmurs.  Darlene Murphy abdomen is soft and nondistended, without any obvious hepatosplenomegaly.  Darlene Murphy lower extremities are warm and without any edema.       RECORD REVIEW/IMAGING:  Esophagram  09/18/19  FINDINGS:     Esophagus: Normal course and caliber. No evidence of constricting or obstructing lesion. No mucosal abnormalities noted. .     GE junction: Normal in location and appearance. No hiatal hernia noted.     Stomach: Visualized portion of the stomach is normal in appearance.     Normal passage of the barium pill through the esophagus into the stomach.     EGD 05/04/19        [facility] - US abdomen 03/08/2018 9:44 PM EST   HISTORY: Clinical suspicion of acute cholecystitis. Abdominal pain.    TECHNIQUE: 4.93 mCi technetium-30mCholetec was intravenously injected and images of the abdomen obtained.     COMPARISON: None.    FINDINGS: Tracer labeling of the small bowel is noted 10 minutes after injection. Tracer labeling of the gallbladder is faintly visualized 90 minutes after injection.    IMPRESSION: No scintigraphic evidence of obstruction of the cystic duct or common bile duct.    IMPRESSION/PLAN  In summary, [name] is a 29y.o. female with abdominal pain, weight loss, and diarrhea. Etiology of Darlene Murphy symptoms remains unclear. While Darlene Murphy may have more bile in Darlene Murphy stomach following cholecystectomy, it is not clear to me we have an explanation for Darlene Murphy symptoms that predated the cholecystectomy. I am doubtful that antireflux surgery would be beneficial and potentially harmful.     Ultimately, Darlene Murphy was disappointed that I didn't have an answer for Darlene Murphy. Both Darlene Murphy an Darlene Murphy mother are upset that the past 2 years has failed to secure an explanation of Darlene Murphy symptoms. I wonder if we should start thinking about the less common diagnoses such as median arcuate ligament syndrome or delve deeper into the possibility of Crohn's. I recommended against any surgical intervention at this time and suggested Darlene Murphy return to GI for further testing. Darlene Murphy mother asked for another opinion and I suggested Dr. DSheppard Coilfrom our GI group here at the [facility]. Will make a referral.     We have not scheduled a  followup but I am happy to see Darlene Murphy at any time should the need arise.     Thank you for the referral. Should you have any questions, please feel free to contact me at any time.      Respectfully yours,      CThompson Grayer PDell Ponto M.D.  Associate Professor of Surgery  Division of Thoracic / [place] [facility] [place]    [address]  [place], [place] [address]  Office: [phone]  Fax: [phone]   Sharene Skeans, NP   Advanced Practice Provider  Division of Thoracic / [place] saw and evaluated the patient with our APP. I have reviewed and edited the note, confirmed the findings and developed the plan of care as documented above.    Rosalina Dingwall G. Tin Engram M.D.

## 2019-09-25 DIAGNOSIS — R768 Other specified abnormal immunological findings in serum: Secondary | ICD-10-CM | POA: Insufficient documentation

## 2019-09-26 ENCOUNTER — Telehealth: Payer: Self-pay | Admitting: Thoracic/Foregut Surgery

## 2019-09-26 NOTE — Telephone Encounter (Signed)
Patient's mom calling asking for a callback to discuss next steps. She is looking for help with daily nutrition, states patient's mental status is depressed and thinking there is no help for her. Would like to know who to connect with in terms of GI referral.

## 2019-09-26 NOTE — Telephone Encounter (Signed)
Mom calls to say a generic referral to GI as is placed due to the specific issue her daughter has, which she says is too lengthly to go thru again but was discusses at last visit with Dr. Charleston Poot.

## 2019-09-26 NOTE — Telephone Encounter (Signed)
Called and spoke with [place]. She was wondering if Dr. Charleston Poot had decided on a GI that would best fit her daughter's needs. Let her know that I would touch base with the office.

## 2019-09-27 ENCOUNTER — Telehealth: Payer: Self-pay

## 2019-09-27 NOTE — Telephone Encounter (Signed)
Mom calling back, just missed [place] call, Informed [employer] of information left, she still would like to discuss with Marian Sorrow

## 2019-09-27 NOTE — Telephone Encounter (Signed)
Spoke to mom, declined to schedule for our first avail, she will contact referring

## 2019-09-27 NOTE — Telephone Encounter (Signed)
Mother is calling back highly frustrated and feels as her daughters case is " not being taken seriously" she has been to 3 GI dr's with no help and that she needs a " Bile Specialist not just a regular GI" and this is why shes frustrated and declined the October appt with GI. She is frustrated and concerned that this may turn into cancer. She is asking for a call back from the PA or Dr directly who met with them on Monday. Please advise.

## 2019-09-27 NOTE — Telephone Encounter (Signed)
Patients mom is calling because she called because she called GI and they gave Jon Gills an appt with Dr Maple Hudson for October 22. She is not happy with the time frame and would like a GI that specializes in "bile" and is looking to get her daughter "fixed" they are both under a lot of stress. Looking for advice and a sooner appt. She feels that the time frame in unacceptable     Any options for another provider to see? Generic referral was placed and no specific provider names by provider.

## 2019-09-27 NOTE — Telephone Encounter (Signed)
Called back and left a voicemail. Explained that we are currently working on a referral to Dr. Sofie Hartigan who is on vacation currently. Invited to call back.    Willia Craze, NP

## 2019-09-27 NOTE — Telephone Encounter (Signed)
Called back ands poke with the patient's mom. Darlene Murphy. Informed of the plan for a referral to Dr. Sofie Hartigan once she is back from vacation. Mom was very anxious because they have tried to set up an appointment with [facility] and got denied. Reassured that she would hear back from our office with more information soon. She was very appreciative of the phone call.    Willia Craze, NP

## 2019-09-27 NOTE — Telephone Encounter (Signed)
Copied from CRM [id number]. Topic: Return Call - Schedule Appointment  >> Sep 27, 2019  8:21 AM Wilhemina Bonito D wrote:  Patients mother, Jake Samples, is returning a call from [place].   She states this is regarding scheduling appt.  Was the appropriate expectation set with the patient for a time frame to receive a return call from the office? Yes  Patient can be reached at: [phone]

## 2019-09-28 ENCOUNTER — Other Ambulatory Visit: Payer: Medicaid Other

## 2019-10-03 NOTE — Telephone Encounter (Signed)
Patient's mother calling back requesting an urgent NPV with Dr. Sofie Hartigan specifically. Jake Samples stated that Dr. Charleston Poot stated that the patient cannot wait until October for an urgent NPV with GI. Writer reached out to back office and was informed Dr. Sofie Hartigan is booked out until December. Writer advised scheduling consult with clinic to become established. Patient's mother can be reached at [phone]

## 2019-10-03 NOTE — Telephone Encounter (Signed)
Darlene Murphy is calling back stating that [name]'s office is offering her December. She is very upset about this being out this far and is asking for Peyre to call her directly.

## 2019-10-03 NOTE — Telephone Encounter (Signed)
Darlene Durie, MD  Ricke Hey; [place], [place]  Caller: Unspecified (6 days ago,  8:22 AM)  I believe I sent a message to jasmine about this pt already

## 2019-10-04 NOTE — Telephone Encounter (Signed)
Mother calling back again for an answer.

## 2019-10-04 NOTE — Telephone Encounter (Signed)
Called back and left a voice mail for [facility] after a discussion with Dr. Charleston Poot. He spoke with Dr. Sofie Hartigan, and their office would be able to move her appointment up to 10/22/19 at 8 am. Asked to call back to confirm she received my message. Please relay this info to the patient if she calls back.    Darlene Craze, NP

## 2019-10-05 NOTE — Telephone Encounter (Signed)
Spoke with mom, confirmed appt. And thankful for appt. Mom was given the number for the office as well.

## 2019-10-05 NOTE — Telephone Encounter (Signed)
Copied from CRM #[id number]. Topic: Appointments - Appointment Information  >> Oct 05, 2019  2:36 PM Joya Salm wrote:  Alexa is calling to verify patient's appointment for 8/23 at 8am. Writer does not see appointment , reached out to back line and spoke with [facility]. Who confirmed

## 2019-10-05 NOTE — Telephone Encounter (Signed)
Copied from CRM [id number]. Topic: Return Call - Schedule Appointment  >> Oct 05, 2019  2:44 PM Joya Salm wrote:  Jake Samples, Patient's Mother is calling to speak with Dr, Sofie Hartigan. Jake Samples is back in [place] and would like to discuss the purpose of this appointment on 8/23. She is wondering if her daughter's condition is surgically correctable and with all the testing and months of waiting can she do this surgery. Jake Samples says it is becoming difficult to come back and forth waiting.  Jake Samples may be [phone]

## 2019-10-08 NOTE — Telephone Encounter (Signed)
Darlene Murphy is calling back asking if our office can get an answer if " this is surgically correctable" she called asking [name]'s office for the answer and was informed that she would need to be seen first. Mother is frustrated with this answer. She is not sure " why after waiting 25 months and having so many appointments no one can answer this, and all the notes should be there to give this answer" Advised Mother that [name] has not met her before and can only see what is in the chart and almost all providers need to meet patient prior to diagnosis and treatment.Darlene Murphy showed some understanding to this. It seems mom is more frustrated about traveling back an forth for appointments, and notes in [place] calls make it look like patient is out of state as well, not just Mom. She was directed back to our office to try and answer if this is correctable by surgery. Not sure how much more we can tell mom until [name] is evaluated by [facility] office. Please advise if there is any more direction we can give mom in the interim .

## 2019-10-08 NOTE — Telephone Encounter (Signed)
Left vm for patients mother. Redirected her to Dr. Charleston Poot regarding discussing patients care prior to NPV appointment and offered video visit to avoid traveling.

## 2019-10-08 NOTE — Telephone Encounter (Signed)
Spoke with [employer]. She again expressed her concerns with upcoming appointment with Dr. Sofie Hartigan. We discussed that at this point it is very difficult to say if her "issue is fixable" since we do not have a diagnosis. Mom expressed her frustration with the fact that she has to travel from out of state to be with her daughter for multiple appointments. She was appreciative of the phone call.  Willia Craze, NP

## 2019-10-22 ENCOUNTER — Encounter: Payer: Self-pay | Admitting: Gastroenterology

## 2019-10-22 ENCOUNTER — Ambulatory Visit
Admission: RE | Admit: 2019-10-22 | Discharge: 2019-10-22 | Disposition: A | Discharge status: Home / Self Care | Payer: Medicaid Other | Source: Ambulatory Visit | Admitting: Gastroenterology

## 2019-10-22 VITALS — BP 103/63 | HR 64 | Temp 97.2°F | Ht 66.0 in | Wt 101.5 lb

## 2019-10-22 DIAGNOSIS — K5900 Constipation, unspecified: Secondary | ICD-10-CM

## 2019-10-22 DIAGNOSIS — R110 Nausea: Secondary | ICD-10-CM

## 2019-10-22 DIAGNOSIS — R634 Abnormal weight loss: Secondary | ICD-10-CM

## 2019-10-22 DIAGNOSIS — R109 Unspecified abdominal pain: Secondary | ICD-10-CM

## 2019-10-22 DIAGNOSIS — G8929 Other chronic pain: Secondary | ICD-10-CM

## 2019-10-22 DIAGNOSIS — Z9049 Acquired absence of other specified parts of digestive tract: Secondary | ICD-10-CM

## 2019-10-22 MED ORDER — FAMOTIDINE 20 MG PO TABS *I*
20.0000 mg | ORAL_TABLET | Freq: Two times a day (BID) | ORAL | 3 refills | Status: DC
Start: 2019-10-22 — End: 2021-12-25

## 2019-10-22 MED ORDER — PROCHLORPERAZINE MALEATE 5 MG PO TABS *I*
5.0000 mg | ORAL_TABLET | Freq: Four times a day (QID) | ORAL | 2 refills | Status: AC | PRN
Start: 2019-10-22 — End: 2019-11-21

## 2019-10-22 NOTE — H&P (Addendum)
Darlene Murphy  [id number]     10/22/2019  Plain, Darlene Beard, MD  PCP       Chief Complaint/Reason for Office Visit:     Dear Dr. Tana Conch,   We had the pleasure of seeing your patient, Darlene Murphy, in the outpatient gastroenterology/hepatology clinic. As you know, she is a 29 y.o. female with a past medical history significant for headache and irregular menses who is referred to our office for LUQ pain.    She was referred by Thoracic Surgery. They did not have surgical recommendation at this time.     Work up has included CT abd/pelvis (2019), EGD, colonoscopy, esophogram, UGISBFT(2019) which were normal. [place] (2019) normal on imaging but CCK did reproduce her typical symptoms. HIDA showed EF 68%. Had cholecystectomy 04/29/2019 with Dr. Radene Knee. Symptoms did not improve. Notes weight loss of 25 lbs since 01/2018 with associated LUQ pain, diarrhea alternating with constipatoin, nausea and food aversion. BMI 16.     She has seen several GI providers previously. Most recently Dr. Einar Gip. Saw Dr. Anastasio Champion and Dr. Junius Roads. EGD 05/04/2019 was most recent EGD. Diffuse gastrists and bile. Cholestyramine was started one daily. Has tried PPIs, carafate, dicyclomine. Never tried nortriptyline although it was prescribed. Previously on zoloft for a short time without relief.     As a child (50 years old) was constipated most of the time. Took MOM as needed. In her teens and 33s she then would have intermittent bouts of diarrhea. Symptoms began in [place] in early 2020 but were mild and intermittent at that time with upper abdominal pain and alternating bowel movements. Had her first endoscopy August 2019 and was unreavealing. Had bad bout of symptoms in December 2019 with upper abdominal pain, severe watery diarrhea, nausea, lightheadedness and syncope and they have been constant since. Has pain everywhere from 'my butt to my mouth' but concentrates in different locations at times. Most common is epigastric and LUQ burning pain/discomfort. Feels  lightheaded and unable to do much. Pain is constant when present. May have a brief relief for a few days. Pain sometimes feels a little better while she is eating but just for a few minutes. However, hard to make herself eat due to the constant nausea. Moving her bowels does not change this pain. Makes cramping better. A few nights ago near where her gallbladder scar is had severe, sharp pain that was burning and woke her from sleep was intermittent for 2 days then resolved.     Takes 1 packet cholestyramine before dinner. Stopped diarrhea (everything was 'falling out of me' when I eat). Has gained about 5 lbs back on this. But is constipating. Has bright red blood with bowel movements. Sometimes moves her bowels twice per week and other times 5 per week. Has been trying to eat fiber or sometimes skip a dose of cholestyramine. Had watery diarrhea after eating since cholecystectomy until cholestyramine resolved this. Has nausea, lightheadedness, 'spinning', syncope. At the same time Gi symptoms began started to have headache in one discrete spot in the proximal right eyebrow. Notes burning. Burning is constant in the epigastric region but reports that it can burn in her chest, ribs, mouth. She gest sore in her mouth.     Lost weight with FODMAP diet and felt worse doing it. Diarrhea was worse. Nutritionist told her to stop this diet. Did try gluten free at one point as well. Now only tries to eat bland food but nothing specific causes symptoms.     Passes  out from menstrual cramps, abdominal pain. Last time was during menses 04/2019. First time was while driving summer 9323. Gets vision changes then passes out.    Has seen Rheum due to + ANA. No diagnosis.     Extensive FHx for Crohn's. P great Uncle, P cousins. Reports handful of people in each general     No alcohol, drug use. When she first got sick quit drinking alcohol, coffee, cigarettes for 4-5 months. Doe now have coffee on a good day. A few cigarettes some  days.     Allergies/Sensitivities:  Allergies   Allergen Reactions    Azithromycin Rash       Medications:   Prior to Admission medications    Medication Sig Start Date End Date Taking? Authorizing Provider   cholestyramine (QUESTRAN) 4 g packet Take 1 packet by mouth daily (with dinner)    [provider]   sucralfate (CARAFATE) 1 GM tablet Take 1 g by mouth 4 times daily (before meals and nightly)    [provider]   SERTRALINE HCL PO Take by mouth    [provider]   pantoprazole (PROTONIX) 40 MG EC tablet Take 1 tablet (40 mg total) by mouth daily   Swallow whole. Do not crush, break, or chew. 10/10/17 11/09/17  Ander Purpura, MD       Past Medical Hx:   Past Medical History:   Diagnosis Date    Abdominal cramping     Anxiety     GERD (gastroesophageal reflux disease)     Headache     Loose stools     Loss of appetite     Nausea        Past Surgical Hx:   Past Surgical History:   Procedure Laterality Date    CHOLECYSTECTOMY      CYST REMOVAL      TONSILLECTOMY      UPPER GASTROINTESTINAL ENDOSCOPY         Social Hx:   Social History     Tobacco Use    Smoking status: Current Every Day Smoker     Packs/day: 0.50     Years: 11.00     Pack years: 5.50     Types: Cigarettes    Smokeless tobacco: Never Used   Substance Use Topics    Alcohol use: Not Currently       Family Hx: History reviewed. No pertinent family history of GI malignancy, liver disease, pancreatic disease, or celiac disease. FHx Crohn's disease in father's side.     ROS:   General:  co malaise, fatigue, weakness. No fever, chills, sweats.  HEENT:  No hearing loss, co visual changes, headache Cardiovascular:  No chest pain, palpitations.  Respiratory:  No dyspnea, cough.  Musculoskeletal:  No myalgias.  GI:  See above.  GU:  No dysuria, hematuria.  Skin:  No rash, pruritus, jaundice.  Neuro:   No focal numbness, co weakness, dizziness no tremor.  Psychiatric:  No confusion, depression.  Endocrine:  No heat or  cold intolerance.  Heme/Lymph:  No easy bruising/bleeding.  No concerning lumps.  Allergy/Rheum:  No arthralgias/arthritis.  No rash/hives.     Vitals:   Vitals:    10/22/19 0750   BP: 103/63   Pulse: 64   Temp: 36.2 C (97.2 F)   Weight: 46 kg (101 lb 8 oz)   Height: 167.6 cm (_0 )      Body mass index is 16.38 kg/m.    Physical Exam:  General: WD thin female   Skin:  No rashes, jaundice.  Warm and dry.   Lymphatics:  No peripheral adenopathy palpated in SCF or cervical chains.    HEENT:  No icterus.  No oropharyngeal abnormalities.    Neck:  No masses or tracheal deviation.  Supple.  Lungs:  Clear bilaterally to auscultation. Normal respiratory effort.    Cor:  RRR without murmur.    Abdomen:  Normal bowel sounds, no bruits. Generally mild tenerness, non-distended. No hepatosplenomegaly, masses, hernias, or fluid wave/shifting dullness.    Extremities:  Warm, no edema.  Neuro:  Alert and oriented x 3 with appropriate affect.  Ambulatory.  No gross motor deficits noted.      GI Procedures:    EGD  05/04/2019      Colonoscopy   03/30/2018        EGD  11/01/2017      Labs/Imaging:     Esophogram   09/18/2019  FINDINGS:     Esophagus: Normal course and caliber. No evidence of constricting or obstructing lesion. No mucosal abnormalities noted. .     GE junction: Normal in location and appearance. No hiatal hernia noted.     Stomach: Visualized portion of the stomach is normal in appearance.     Normal passage of the barium pill through the esophagus into the stomach.       Impression     Unremarkable esophagram          HIDA  04/20/2018  FINDINGS: No focal hepatic lesion is seen. Tracer is excreted into the biliary tract by 5 to 10 minutes, identified in the gallbladder by 5 to 10 minutes, and tracer is excreted in the small bowel by 15 minutes. The gallbladder ejection fraction is 68%   with normal being 40% or higher.     IMPRESSION: No evidence of cystic duct or common bile duct obstruction. Normal gallbladder  ejection fraction of 68%. The patient noted abdominal pain during infusion of CCK.     HIDA  03/08/2018  FINDINGS: Tracer labeling of the small bowel is noted 10 minutes after injection. Tracer labeling of the gallbladder is faintly visualized 90 minutes after injection.     IMPRESSION: No scintigraphic evidence of obstruction of the cystic duct or common bile duct.     UGISBFT  02/03/2018  Upper GI: The patient swallowed barium in a normal fashion. The esophagus and esophagogastric junction appear normal. No stricture, ulceration, or mass can be seen involving the esophagus, stomach, or proximal duodenum. No hiatal hernia or   gastroesophageal reflux is noted. Esophageal peristalsis appears normal.     Small Bowel Followthrough: The bowel gas pattern and bony structures are normal in appearance. Delayed films show normal transit of contrast material through the small bowel. There is no small-bowel dilatation, stricture, or mass. The terminal ileum is   well visualized and is also normal in appearance.     IMPRESSION: Negative examination.     CT abdomen/pelvis wow contrast  01/31/2018  FINDINGS:     Lung Bases: Clear.     Liver: Homogenous. No focal lesions identified.     Biliary Tree/Gallbladder: Unremarkable.     Pancreas: Normal.     Spleen: Homogenous. No focal lesions identified.     Lymph Nodes: No enlarged nodes by CT size criteria (greater than 1 cm).     Vasculature: Unremarkable.     Adrenal Glands: Normal.     Kidneys: Normal.     GI Tract:  Unremarkable. No evidence of bowel obstruction or acute appendicitis.     Bladder: Unremarkable.     Uterus/Adnexae: Unremarkable.     Musculoskeletal/Soft Tissues: Unremarkable.     Impression     Unremarkable examination.      [place]  10/10/2017  FINDINGS:     Liver: 14.1 cm craniocaudad. Unremarkable     Gallbladder: No stones. No wall thickening. No pericholecystic fluid. No significant tenderness with transducer pressure.     Gallbladder Wall: 0.1 cm     Common  Bile Duct: 0.1 cm     Right Kidney: 10.5 cm in length. Unremarkable     Impression     1. No sonographic evidence of cholelithiasis.     2. No sonographic evidence for acute gallbladder disease.            02/10/2015 14:16   tTG,IgA 3.1      01/31/2018 12:30 02/26/2018 08:05 07/20/2019 14:47   WBC 13.0 (H) 4.8 3.6 (L)   RED BLOOD CELL COUNT 4.4 4.3 4.5   Hemoglobin 13.7 13.6 13.6   Hematocrit 40 39 41   MCV 91 90 91   MCH 31 32 30   MCHC 34 35 33   RDW 11.4 (L) 11.4 (L) 11.5 (L)   Platelets 174 203 216      01/31/2018 12:30 02/14/2018 14:24 02/26/2018 08:05 07/20/2019 14:47   Sodium 141      Sodium,Plasma   139    Potassium 4.0      Potassium,Plasma   3.8    Chloride 106      Chloride,Plasma   99    CO2 22      CO2,Plasma   26    Anion Gap 13      Anion Gap,PL   14    UN 11      UN,Plasma   10    Creatinine 0.86  0.78    GFR,Black 107      GFR,Black   121    GFR,Caucasian 93      GFR,Caucasian   105    GLUCOSE 97      Glucose,Plasma   86    Calcium 9.2      Total Protein 6.9  7.4    Albumin 4.6  5.1    Globulin 2.3 (L)      ALT 11  19    AST 17  CANCELED    Alk Phos 42  44    Amylase 44  56    Bilirubin,Direct <0.2  <0.2    Bilirubin,Total 0.4  0.6    Lipase 20  42    Folate  >14.0  >14.0   Vitamin B12  359       Impression(s)/Recommendation(s):   Darlene Murphy  is a 29 y.o. female with a past medical history significant for headache and irregular menses who is referred to our office for chronic abdominal pain. She had constipation as a young child and then diarrhea in her teens or 100s intermittently but began to have symptoms mildly in Summer 2019 and in December 2019 had episode of severe pain and diarrhea and has had symptoms constantly since then. She has seen 3 prior GI providers, Nutrition, Rheum, Thoracic Surgery and Surgery. She had cholecystectomy which did not change pain, caused diarrhea for a time. 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 Tends  more now towards constipation.     Plan:  1) Abdominal Pain   - leading differential include DGBI, gastroparesis and Crohn's Disease   - there is concern for malnutrition given her underweight status  - prealbumin, vitamin B 12, vitamin D, iron studies, folate, zince, CBC, CMP, CRP  - fecal calprotectin   - GES to rule out gastroparesis   - CT enterography to rule out small bowel Crohn's disease, stricture, inflammatory process   - Pepcid 20 mg BID  - 2 kiwi daily for constipation   - Compazine 5 mg as needed for nausea  - Consider Linzess in the future     Follow up in 6 weeks     This patient was seen, evaluated, and plan determined by Dr. [name].  We thank you for allowing [place] to participate in this patient's care.  Please do not hesitate to contact us with any questions or concerns at [phone].    Shellia Carwin, PA    GI ATTENDING    I saw, examined and evaluated the patient, reviewed the patient chart and the relevant images and lab findings.    Young woman with LUQ/epigastric abd pain, alternating constipation and diarrhea and constant severe nausea. Has a h/o many years of GI symptoms starting in early childhood, but worsened in 9/19.     An extensive w/u has been done, cholecystectomy for pain with CCK on HIDA did not relieve her symptoms. she started having severe diarrhea after this.   EGD 3/21 showed gastritis and was treated for bile acid gastritis with cholestyramine, which is helping the diarrhea, but not the pain.   currently constipated with 1-3 BMs/day  Pt with  FH of Crohns in paternal great uncles/cousins  Has lost a lot of weight and avoids eating due to symptoms    On my exam I find:  General: well appearing female in no acute distress  Skin:  No rashes, jaundice, spider angiomata, palmar erythema, or telangiectasias.  Warm and dry.   Lymphatics:  No peripheral adenopathy palpated in SCF or cervical chains.    HEENT:   no glossitis, cheilosis, or icterus.  No oropharyngeal  abnormalities.    Neck:  No masses or tracheal deviation.  Supple.  Lungs:  Clear bilaterally to auscultation. Normal respiratory effort.    Cor:  RRR without murmur, rub, or gallop.  No heave or thrill.    Abdomen:  Normal bowel sounds, no bruits.  Mild TTP diffusely, non-distended. No hepatosplenomegaly, masses, hernias, or fluid wave/shifting dullness.    Extremities:  Warm, no edema.  Neuro:  Alert and oriented x 3 with appropriate affect.  Ambulatory.  No gross motor deficits noted.      DiffDx includes small bowel Crohns disease, DGBI, gastroparesis. She appears malnourished  Will obtain extensive labs including assessing her nutritional status, including calprotectin  CTE  GES  Compazine prn nausea  2 kiwi daily for constipation; will consider linzess in the future  Will need to consider neuromodulation and CBT in the future depending on response  F/u 6 weeks    Sheppard Coil, MD  Associate Professor of Medicine  Attending Physician, Division of Gastroenterology and Hepatology, [place] [address]  [phone]

## 2019-10-22 NOTE — Discharge Instructions (Signed)
Blood and stool test  CT enterography  Gastric emptying test   Follow up 6 weeks  Pepcid 20 mg twice per day   2 kiwi daily   companize as needed   Consider Covid vaccination

## 2019-10-31 ENCOUNTER — Telehealth: Payer: Self-pay | Admitting: Gastroenterology

## 2019-10-31 NOTE — Telephone Encounter (Signed)
Copied from CRM #[id number]. Topic: Appointments - Appointment Information  >> Oct 31, 2019  1:45 PM Tona Sensing T wrote:  Jake Samples the patients mom is calling to speak to Dr.[name] or a nurse. She has some questions on her daughter.   Jake Samples can be reached at [phone].

## 2019-11-01 NOTE — Telephone Encounter (Signed)
Copied from CRM [id number]. Topic: Access to Care - Speak to Provider/Office Staff  >> Nov 01, 2019 10:21 AM Inge Rise wrote:  Patient has been tested for months now and patient mother has questions about test. She wants to know .wants going on. Patient mom can be reached at [phone]

## 2019-11-01 NOTE — Telephone Encounter (Signed)
Called patient, her mom is not listed in fyi for GI, would need to be added if patient desires. No answer, left VM for pt to return call and let us know if she has specific concerns. Number provided.

## 2019-11-02 NOTE — Telephone Encounter (Signed)
Copied from CRM [id number]. Topic: Return Call - Speak to Provider/Office Staff  >> Nov 02, 2019  1:22 PM Darlene Murphy wrote:  Darlene Murphy the patients mom is calling to speak to Dr.[name] or a nurse. She has some questions on her daughter.   Darlene Murphy can be reached at [phone].  Darlene Murphy became very upset with Clinical research associate when explained that a message would be sent .

## 2019-11-02 NOTE — Telephone Encounter (Signed)
Called and spoke with patient's mom Athena. Let her know that until we can speak with her daughter, to get consent, we are unable to share any information about her daughter's medical concerns with her.   She was able to connect her daughter on 3-way call. Writer used 2 identifiers to ensure I was speaking to patient. Patient gave Clinical research associate permission to speak with her mom on her behalf. Encouraged her to fill out form when next in the office to allow [place] to speak to her mom.     Patient's mom Jake Samples said that patient has been spewing bile out of her mouth (vomiting) and out of her colon (stool). She said patient is 5'6" and only 95 lbs now. She wanted to know a little more about imaging procedures Dr. Sofie Hartigan ordered. Writer reviewed these with her. She stated she wants to make sure the doctor knows that her spouse (patient's dad) has long family history of Chron's - both his sister and brother have it as well as multiple other family members on his side. She said she has been to multiple MD's trying to get answers and she is very concerned with daughter's condition.   Writer reassured her. She had no further concerns at this time.

## 2019-11-08 ENCOUNTER — Telehealth: Payer: Self-pay

## 2019-11-08 NOTE — Telephone Encounter (Signed)
Spoke with patient and scheduled FUV       Informed patient (Labs, Radiology, etc.):  Pt was given the phone number to call and schedule CTE, GES., pt aware of labs    Patient was seen for In-Office visit, on 10/22/19, by GI PROVIDER NAME: Dr. Wilhemena Durie. Provider's recommendations were:  .   6weeks in person  Labs  CTE  GES      AVS sent to patient via my chart.

## 2019-11-09 ENCOUNTER — Ambulatory Visit
Admission: RE | Admit: 2019-11-09 | Discharge: 2019-11-09 | Disposition: A | Discharge status: Home / Self Care | Payer: Medicaid Other | Source: Ambulatory Visit | Attending: Gastroenterology | Admitting: Gastroenterology

## 2019-11-09 DIAGNOSIS — R110 Nausea: Secondary | ICD-10-CM

## 2019-11-09 DIAGNOSIS — K5900 Constipation, unspecified: Secondary | ICD-10-CM

## 2019-11-09 DIAGNOSIS — R634 Abnormal weight loss: Secondary | ICD-10-CM

## 2019-11-09 DIAGNOSIS — R109 Unspecified abdominal pain: Secondary | ICD-10-CM | POA: Insufficient documentation

## 2019-11-09 DIAGNOSIS — Z9049 Acquired absence of other specified parts of digestive tract: Secondary | ICD-10-CM

## 2019-11-09 DIAGNOSIS — G8929 Other chronic pain: Secondary | ICD-10-CM

## 2019-11-09 MED ORDER — IOHEXOL 350 MG/ML (OMNIPAQUE) IV SOLN *I*
1.0000 mL | Freq: Once | INTRAVENOUS | Status: AC
Start: 2019-11-09 — End: 2019-11-09
  Administered 2019-11-09: 100 mL via INTRAVENOUS

## 2019-11-09 MED ORDER — FLAVORED NEUTRAL CONTRAST (BREEZA) PO SUSP *I*
1000.0000 mL | Freq: Once | Status: AC
Start: 2019-11-09 — End: 2019-11-09
  Administered 2019-11-09: 1000 mL via ORAL

## 2019-11-12 NOTE — Result QuickNote (Signed)
Dear Jon Gills,   Your CTE revealed no evidence of Crohns disease.   Please follow up with me as scheduled.   Sincerely,   Dr Sofie Hartigan

## 2019-11-14 ENCOUNTER — Encounter: Payer: Self-pay | Admitting: Gastroenterology

## 2019-11-16 ENCOUNTER — Ambulatory Visit
Admission: RE | Admit: 2019-11-16 | Discharge: 2019-11-16 | Disposition: A | Discharge status: Home / Self Care | Payer: Medicaid Other | Source: Ambulatory Visit

## 2019-11-16 ENCOUNTER — Ambulatory Visit: Payer: Medicaid Other

## 2019-11-16 ENCOUNTER — Ambulatory Visit
Admission: RE | Admit: 2019-11-16 | Discharge: 2019-11-16 | Disposition: A | Discharge status: Home / Self Care | Payer: Medicaid Other | Source: Ambulatory Visit | Attending: Gastroenterology | Admitting: Gastroenterology

## 2019-11-16 DIAGNOSIS — G8929 Other chronic pain: Secondary | ICD-10-CM

## 2019-11-16 DIAGNOSIS — R634 Abnormal weight loss: Secondary | ICD-10-CM

## 2019-11-16 DIAGNOSIS — R110 Nausea: Secondary | ICD-10-CM

## 2019-11-16 DIAGNOSIS — R109 Unspecified abdominal pain: Secondary | ICD-10-CM

## 2019-11-16 DIAGNOSIS — Z9049 Acquired absence of other specified parts of digestive tract: Secondary | ICD-10-CM

## 2019-11-16 DIAGNOSIS — K5900 Constipation, unspecified: Secondary | ICD-10-CM

## 2019-11-16 DIAGNOSIS — K9289 Other specified diseases of the digestive system: Secondary | ICD-10-CM | POA: Insufficient documentation

## 2019-11-16 MED ORDER — TECHNETIUM TC-99M SULFUR COLLOID IV *I*
0.9000 | Freq: Once | INTRAVENOUS | Status: AC
Start: 2019-11-16 — End: 2019-11-16
  Administered 2019-11-16: 1 via ORAL

## 2019-11-17 ENCOUNTER — Other Ambulatory Visit
Admission: RE | Admit: 2019-11-17 | Discharge: 2019-11-17 | Disposition: A | Discharge status: Home / Self Care | Payer: Medicaid Other | Source: Ambulatory Visit | Attending: Gastroenterology | Admitting: Gastroenterology

## 2019-11-17 DIAGNOSIS — G8929 Other chronic pain: Secondary | ICD-10-CM

## 2019-11-17 DIAGNOSIS — Z9049 Acquired absence of other specified parts of digestive tract: Secondary | ICD-10-CM | POA: Insufficient documentation

## 2019-11-17 DIAGNOSIS — R110 Nausea: Secondary | ICD-10-CM | POA: Insufficient documentation

## 2019-11-17 DIAGNOSIS — R109 Unspecified abdominal pain: Secondary | ICD-10-CM | POA: Insufficient documentation

## 2019-11-17 DIAGNOSIS — R634 Abnormal weight loss: Secondary | ICD-10-CM | POA: Insufficient documentation

## 2019-11-17 DIAGNOSIS — K5900 Constipation, unspecified: Secondary | ICD-10-CM | POA: Insufficient documentation

## 2019-11-17 LAB — CRP: CRP: <3 mg/L (ref 0–8)

## 2019-11-17 LAB — CBC AND DIFFERENTIAL
Baso # K/uL: 0 10*3/uL (ref 0.0–0.1)
Basophil %: 0.4 %
Eos # K/uL: 0 10*3/uL (ref 0.0–0.4)
Eosinophil %: 0.8 %
Hematocrit: 39 % (ref 34–45)
Hemoglobin: 13.7 g/dL (ref 11.2–15.7)
IMM Granulocytes #: 0 10*3/uL (ref 0.0–0.0)
IMM Granulocytes: 0.2 %
Lymph # K/uL: 2 10*3/uL (ref 1.2–3.7)
Lymphocyte %: 42 %
MCH: 32 pg (ref 26–32)
MCHC: 35 g/dL (ref 32–36)
MCV: 90 fL (ref 79–95)
Mono # K/uL: 0.5 10*3/uL (ref 0.2–0.9)
Monocyte %: 10.6 %
Neut # K/uL: 2.2 10*3/uL (ref 1.6–6.1)
Nucl RBC # K/uL: 0 10*3/uL (ref 0.0–0.0)
Nucl RBC %: 0 /100 WBC (ref 0.0–0.2)
Platelets: 205 10*3/uL (ref 160–370)
RBC: 4.3 MIL/uL (ref 3.9–5.2)
RDW: 11.4 % — ABNORMAL LOW (ref 11.7–14.4)
Seg Neut %: 46 %
WBC: 4.8 10*3/uL (ref 4.0–10.0)

## 2019-11-17 LAB — TIBC
Iron: 204 ug/dL — ABNORMAL HIGH (ref 34–165)
TIBC: 302 ug/dL (ref 250–450)
Transferrin Saturation: 68 % — ABNORMAL HIGH (ref 15–50)

## 2019-11-17 LAB — PREALBUMIN: Prealbumin: 22 mg/dL (ref 20–40)

## 2019-11-17 LAB — COMPREHENSIVE METABOLIC PANEL
ALT: 24 U/L (ref 0–35)
AST: 22 U/L (ref 0–35)
Albumin: 4.7 g/dL (ref 3.5–5.2)
Alk Phos: 47 U/L (ref 35–105)
Anion Gap: 13 (ref 7–16)
Bilirubin,Total: 0.7 mg/dL (ref 0.0–1.2)
CO2: 24 mmol/L (ref 20–28)
Calcium: 9.4 mg/dL (ref 8.8–10.2)
Chloride: 105 mmol/L (ref 96–108)
Creatinine: 0.84 mg/dL (ref 0.51–0.95)
GFR,Black: 109 *
GFR,Caucasian: 94 *
Glucose: 78 mg/dL (ref 60–99)
Lab: 12 mg/dL (ref 6–20)
Potassium: 4.2 mmol/L (ref 3.3–5.1)
Sodium: 142 mmol/L (ref 133–145)
Total Protein: 7.1 g/dL (ref 6.3–7.7)

## 2019-11-17 LAB — VITAMIN D: 25-OH Vit Total: 55 ng/mL (ref 30–60)

## 2019-11-17 LAB — FOLATE: Folate: >14 ng/mL (ref >=4.6)

## 2019-11-17 LAB — FERRITIN: Ferritin: 31 ng/mL (ref 10–120)

## 2019-11-17 LAB — VITAMIN B12: Vitamin B12: 509 pg/mL (ref 232–1245)

## 2019-11-19 ENCOUNTER — Other Ambulatory Visit
Admission: RE | Admit: 2019-11-19 | Discharge: 2019-11-19 | Disposition: A | Discharge status: Home / Self Care | Payer: Medicaid Other | Source: Ambulatory Visit | Attending: Gastroenterology | Admitting: Gastroenterology

## 2019-11-19 DIAGNOSIS — R634 Abnormal weight loss: Secondary | ICD-10-CM | POA: Insufficient documentation

## 2019-11-19 DIAGNOSIS — K5900 Constipation, unspecified: Secondary | ICD-10-CM

## 2019-11-19 DIAGNOSIS — G8929 Other chronic pain: Secondary | ICD-10-CM | POA: Insufficient documentation

## 2019-11-19 DIAGNOSIS — R109 Unspecified abdominal pain: Secondary | ICD-10-CM | POA: Insufficient documentation

## 2019-11-19 DIAGNOSIS — R110 Nausea: Secondary | ICD-10-CM | POA: Insufficient documentation

## 2019-11-19 DIAGNOSIS — Z9049 Acquired absence of other specified parts of digestive tract: Secondary | ICD-10-CM

## 2019-11-20 ENCOUNTER — Encounter: Payer: Self-pay | Admitting: Gastroenterology

## 2019-11-21 LAB — ZINC: Zinc: 108 ug/dL (ref 60.0–120.0)

## 2019-11-22 DIAGNOSIS — R110 Nausea: Secondary | ICD-10-CM | POA: Insufficient documentation

## 2019-11-22 LAB — CALPROTECTIN, FECAL: Calprotectin,Fecal: 61 ug/g — ABNORMAL HIGH (ref <=49)

## 2019-11-23 ENCOUNTER — Other Ambulatory Visit
Admission: RE | Admit: 2019-11-23 | Discharge: 2019-11-23 | Disposition: A | Discharge status: Home / Self Care | Payer: Medicaid Other | Source: Ambulatory Visit | Attending: Internal Medicine | Admitting: Internal Medicine

## 2019-11-23 ENCOUNTER — Other Ambulatory Visit: Payer: Self-pay | Admitting: Gastroenterology

## 2019-11-23 DIAGNOSIS — Z0189 Encounter for other specified special examinations: Secondary | ICD-10-CM | POA: Insufficient documentation

## 2019-11-23 LAB — TIBC
Iron: 111 ug/dL (ref 34–165)
TIBC: 304 ug/dL (ref 250–450)
Transferrin Saturation: 37 % (ref 15–50)

## 2019-11-23 LAB — MCHC: MCHC: 34 g/dL (ref 32–36)

## 2019-11-23 LAB — HEMATOCRIT: Hematocrit: 41 % (ref 34–45)

## 2019-12-10 ENCOUNTER — Encounter: Payer: Self-pay | Admitting: Gastroenterology

## 2019-12-10 ENCOUNTER — Ambulatory Visit
Admission: RE | Admit: 2019-12-10 | Discharge: 2019-12-10 | Disposition: A | Discharge status: Home / Self Care | Payer: Medicaid Other | Source: Ambulatory Visit | Admitting: Gastroenterology

## 2019-12-10 VITALS — BP 110/66 | HR 77 | Temp 96.8°F | Ht 66.0 in | Wt 107.6 lb

## 2019-12-10 DIAGNOSIS — K582 Mixed irritable bowel syndrome: Secondary | ICD-10-CM

## 2019-12-10 DIAGNOSIS — R109 Unspecified abdominal pain: Secondary | ICD-10-CM

## 2019-12-10 DIAGNOSIS — G8929 Other chronic pain: Secondary | ICD-10-CM
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
## 2019-12-10 NOTE — Discharge Instructions (Signed)
Please call Dr. Kerby Nora who specializes in treating patients like yourself. His number is [phone]    Please pick up IB Guard and retry two capsules with every meal, you can find more information at [email or web address]    Please call [facility] to try and schedule a new appointment with a new PCP at [phone] or [phone].    We will schedule you for a hydrogen breath test    Please follow up in two months in the office with Dr. Sofie Hartigan

## 2019-12-12 NOTE — Progress Notes (Signed)
Darlene Murphy  Age: 29 y.o.  DOB: 03/23/1990  MRN: [id number]  [address]  [place] [address]  Preferred language: ENGLISH    Referring Provider: PCP Dr. Greggory Stallion Plain  Reason: Elevated iron  Assessment:   11/28/19 PCP OV notes MEDIA  11/20/19 Gastro Pt message  12/10/19 GI OV note CHART REVIEW    Labs: RESULTS REVIEW  Imaging: CHART REVIEW Image      Past Medical History:   Diagnosis Date    Abdominal cramping     Anxiety     GERD (gastroesophageal reflux disease)     Headache     Loose stools     Loss of appetite     Nausea        Current Outpatient Medications on File Prior to Visit   Medication Sig Dispense Refill    famotidine (PEPCID) 20 MG tablet Take 1 tablet (20 mg total) by mouth 2 times daily 60 tablet 3    cholestyramine (QUESTRAN) 4 g packet Take 1 packet by mouth daily (with dinner)       No current facility-administered medications on file prior to visit.

## 2019-12-18 ENCOUNTER — Other Ambulatory Visit: Payer: Self-pay

## 2019-12-18 ENCOUNTER — Telehealth: Payer: Self-pay

## 2019-12-18 DIAGNOSIS — Z01812 Encounter for preprocedural laboratory examination: Secondary | ICD-10-CM

## 2019-12-18 NOTE — Telephone Encounter (Signed)
I called the patient, no answer. I left a detailed voicemail advising: patient to contact our office to schedule a FUV woth Dr. Sofie Hartigan on 12/13 at 10 am. Upon call back please skype

## 2019-12-26 ENCOUNTER — Encounter: Payer: Self-pay | Admitting: Gastroenterology

## 2019-12-26 ENCOUNTER — Telehealth: Payer: Self-pay

## 2019-12-26 NOTE — Telephone Encounter (Signed)
Patient was called to confirm HBT scheduled for 01/01/2020. No answer, voicemail left reminding them of the dietary restrictions associated with their upcoming test. Voicemail left with number, [phone] for call back if they have any questions or did not receive instructions for test. Patient was also reminded of COVID Testing at a [facility] Pre-Pro[facility] Testing Site is required no more than 5 days prior to procedure REGARDLESS of vaccination status.     Upon call back, confirm date, time, instructions and patient aware of Covid-19 test that must be completed no more than 5 days prior to HBT if not fully vaccinated.    Copy of HBT instructions sent to patients MyChart.

## 2019-12-27 ENCOUNTER — Ambulatory Visit: Payer: Medicaid Other | Attending: Gastroenterology

## 2019-12-27 ENCOUNTER — Telehealth: Payer: Self-pay

## 2019-12-27 DIAGNOSIS — Z01812 Encounter for preprocedural laboratory examination: Secondary | ICD-10-CM

## 2019-12-27 DIAGNOSIS — Z20822 Contact with and (suspected) exposure to covid-19: Secondary | ICD-10-CM | POA: Insufficient documentation

## 2019-12-27 DIAGNOSIS — Z20828 Contact with and (suspected) exposure to other viral communicable diseases: Secondary | ICD-10-CM | POA: Insufficient documentation

## 2019-12-27 LAB — COVID-19 NAAT (PCR): COVID-19 NAAT (PCR): NEGATIVE

## 2019-12-27 LAB — COVID-19 PCR

## 2019-12-27 NOTE — Telephone Encounter (Signed)
Copied from CRM [id number]. Topic: Appointments - Schedule Appointment  >> Dec 27, 2019 12:56 PM Shirlee Limerick wrote:  Pt calling to schedule a . FUV with Dr. Sofie Hartigan on 12/13 at 10 am.   But this message was put in on 10-19 and that appt prob is gone now.   Writer tried to get someone in back office with no ans.    Please call her to schedule

## 2019-12-28 MED ORDER — OMEPRAZOLE 40 MG PO CPDR *I*
40.0000 mg | DELAYED_RELEASE_CAPSULE | Freq: Every day | ORAL | 5 refills | Status: DC
Start: 2019-12-28 — End: 2021-12-25

## 2020-01-01 ENCOUNTER — Ambulatory Visit
Admission: RE | Admit: 2020-01-01 | Discharge: 2020-01-01 | Disposition: A | Discharge status: Home / Self Care | Payer: Medicaid Other | Source: Ambulatory Visit

## 2020-01-01 DIAGNOSIS — K582 Mixed irritable bowel syndrome: Secondary | ICD-10-CM

## 2020-01-01 LAB — HYDROGEN BREATH TEST - GLUCOSE 75 GRAMS P.O. X1

## 2020-01-01 NOTE — Progress Notes (Signed)
Patient completed Glucose HBT. Patient REPORTED CONSTANT BURPING

## 2020-01-01 NOTE — Progress Notes (Signed)
Patient arrived for Glucose HBT. Pre-procedure covid test was negative. Patient was afebrile. Patient followed proper prep. Glucose carbohydrate 75g administered. Patient tolerated well. Final results to be sent to ordering provider within 7-10 days.

## 2020-01-02 ENCOUNTER — Encounter: Payer: Self-pay | Admitting: Gastroenterology

## 2020-01-04 ENCOUNTER — Encounter: Payer: Self-pay | Admitting: Gastroenterology

## 2020-01-04 ENCOUNTER — Telehealth: Payer: Self-pay

## 2020-01-04 ENCOUNTER — Telehealth: Payer: Self-pay | Admitting: Gastroenterology

## 2020-01-04 ENCOUNTER — Ambulatory Visit: Payer: Medicaid Other | Admitting: Hematology and Oncology

## 2020-01-04 NOTE — Telephone Encounter (Signed)
Copied from CRM #[id number]. Topic: Access to Care - Speak to Provider/Office Staff  >> Jan 04, 2020  1:07 PM Inge Rise wrote:  .Patient mom is calling to schedule appointment. Patient Mom can be reached at [phone]

## 2020-01-04 NOTE — Telephone Encounter (Signed)
Copied from CRM #[id number]. Topic: Access to Care - Speak to Provider/Office Staff  >> Jan 04, 2020  1:20 PM Karlo Goeden, Bethann Berkshire wrote:  Patients mom Jake Samples is calling asking to speak with Dr. Sofie Hartigan regarding what the next steps are in her daughters care, Jake Samples does not want to schedule her daughter a FUV until she speaks with Dr. Sofie Hartigan.Jake Samples says her daughter is malnourished and needs to speak with Dr. Sofie Hartigan.  Jake Samples can be reached at [phone]

## 2020-01-04 NOTE — Telephone Encounter (Signed)
Went straight to voice mail. Left message requesting call back to direct line.

## 2020-01-08 DIAGNOSIS — J069 Acute upper respiratory infection, unspecified: Secondary | ICD-10-CM | POA: Insufficient documentation

## 2020-01-08 DIAGNOSIS — R5383 Other fatigue: Secondary | ICD-10-CM | POA: Insufficient documentation

## 2020-01-17 ENCOUNTER — Telehealth: Payer: Self-pay | Admitting: Gastroenterology

## 2020-01-17 NOTE — Telephone Encounter (Signed)
Copied from CRM #[id number]. Topic: Appointments - Schedule Appointment  >> Jan 17, 2020 11:16 AM Darlene Murphy D wrote:  The patient is calling to schedule her FUV with Dr. Sofie Hartigan.    Please callback patient to schedule.     The patient can be reached for scheduling at: [phone]

## 2020-01-21 ENCOUNTER — Telehealth: Payer: Self-pay | Admitting: Gastroenterology

## 2020-01-21 NOTE — Telephone Encounter (Signed)
Copied from CRM [id number]. Topic: Return Call - Schedule Appointment  >> Jan 21, 2020 11:48 AM Inge Rise wrote:  Patient is requesting an appointment with [facility].Patient can be reached at [phone].

## 2020-01-28 ENCOUNTER — Encounter: Payer: Self-pay | Admitting: Gastroenterology

## 2020-02-05 NOTE — Telephone Encounter (Signed)
Left vm for patient to call and schedule

## 2020-02-08 ENCOUNTER — Telehealth: Payer: Self-pay

## 2020-02-08 ENCOUNTER — Ambulatory Visit: Payer: Medicaid Other | Attending: Hematology and Oncology | Admitting: Hematology and Oncology
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
## 2020-02-08 NOTE — Progress Notes (Signed)
Benign Hematology Progress Note    Chief Complaint   Elevated iron  level  Normalized on repeat testing     History of Present Illness   Has major gi issues sound like IBS but still seeking diagnosis after seeing various gi spl  Thinks may have environmental toxicity wants blood tested for toxins  Mold in damp wet basement       Interval History   Thinks she has inflamed gi tract      Medical History     Past Medical History:   Diagnosis Date    Abdominal cramping     Anxiety     GERD (gastroesophageal reflux disease)     Headache     Loose stools     Loss of appetite     Nausea        Surgical History     Past Surgical History:   Procedure Laterality Date    CHOLECYSTECTOMY      COLONOSCOPY      CYST REMOVAL      TONSILLECTOMY      UPPER GASTROINTESTINAL ENDOSCOPY         Family History     Family History   Problem Relation Age of Onset    Depression Mother     Arthritis Mother     Cancer Father     Heart Disease Father     Pancreatic Cancer Paternal Uncle     Crohn's disease Paternal cousin     Crohn's disease Paternal Uncle     Colon cancer Neg Hx     Celiac disease Neg Hx        Social History     Social History     Socioeconomic History    Marital status: Single     Spouse name: Not on file    Number of children: Not on file    Years of education: Not on file    Highest education level: Not on file   Tobacco Use    Smoking status: Current Every Day Smoker     Packs/day: 0.50     Years: 11.00     Pack years: 5.50     Types: Cigarettes    Smokeless tobacco: Never Used   Substance and Sexual Activity    Alcohol use: Not Currently    Drug use: No    Sexual activity: Not Currently   Other Topics Concern    Not on file   Social History Narrative    Not on file       Current Medications     Current Outpatient Medications   Medication Sig    omeprazole (PRILOSEC) 40 mg capsule Take 1 capsule (40 mg total) by mouth daily    famotidine (PEPCID) 20 MG tablet Take 1 tablet (20 mg total) by  mouth 2 times daily    cholestyramine (QUESTRAN) 4 g packet Take 1 packet by mouth daily (with dinner) (Patient not taking: Reported on 02/08/2020)       Physical Exam   There were no vitals taken for this visit.  Wt Readings from Last 3 Encounters:   12/10/19 48.8 kg (107 lb 9.6 oz)   10/22/19 46 kg (101 lb 8 oz)   09/24/19 45.5 kg (100 lb 4.8 oz)        General Appearance: Pleasant. Alert/interactive. No acute distress.   HEENT:  Normocephalic, atraumatic, Conjunctiva/corneas clear, no eye drainage, moist mucus membranes, oropharynx benign   Neck:  Supple, no lymphadenopathy  Heart: S1, S2 RRR, no murmur, rub, or gallop   Lungs:  Normal WOB, CTA bilaterally   Abdomen: Soft, Normoactive bowel sounds, non distended, non tender, tympanic, no organomegaly or masses   Extremities: No edema, 2+ pedal pulses   Neuro:  AOx3, speech/language WNL, moving all extremities, strength/sensation grossly intact, Coordination/gait not formally tested.   Skin: No rashes or lesions, no ulcers         Imaging:  No results found.    Labs:      Lab results: 11/17/19  1104   Sodium 142   Potassium 4.2   Chloride 105   CO2 24   UN 12   Creatinine 0.84   GFR,Caucasian 94   GFR,Black 109   Glucose 78   Calcium 9.4   Total Protein 7.1   Albumin 4.7   ALT 24   AST 22   Alk Phos 47   Bilirubin,Total 0.7             Lab results: 11/23/19  1405 11/17/19  1104 07/20/19  1447   WBC  --  4.8 3.6*   Hemoglobin  --  13.7 13.6   Hematocrit 41 39 41   RBC  --  4.3 4.5   Platelets  --  205 216         Impression/Plan   Darlene Murphy is a 29 y.o. female with a history of IBS elevated serum ferritin normalized on repeat testing   Reassurance provided there is no iorn disorder to be concerned about          Sabino Donovan, MBBS 02/08/20 1:13 PM

## 2020-02-26 NOTE — Telephone Encounter (Signed)
Copied from CRM #[id number]. Topic: Appointments - Schedule Appointment  >> Feb 26, 2020  4:11 PM Erhard Senske, Bethann Berkshire wrote:  Ms. Brandau has been scheduled for an appointment.    Type of appointment: FUV  Date: 04/07/2020  Time: 9am  Provider: Sofie Hartigan  Location: [facility]    Patient can be reached if necessary at [phone].

## 2020-02-29 ENCOUNTER — Encounter: Payer: Self-pay | Admitting: Gastroenterology

## 2020-03-10 MED ORDER — LINACLOTIDE 72 MCG PO CAPS *I*
72.0000 ug | ORAL_CAPSULE | Freq: Every day | ORAL | 1 refills | Status: AC
Start: 2020-03-10 — End: 2020-04-09

## 2020-03-11 ENCOUNTER — Telehealth: Payer: Self-pay | Admitting: Hematology and Oncology

## 2020-03-11 NOTE — Telephone Encounter (Signed)
I called [facility] back and spoke with a the prior auth department and this was a self-referral placed by the patient or  Mother using Dr. Fernanda Drum name. [facility] Gala Lewandowsky was trying to figure out if the patient was transferring care and relocating there. I explained it was for a second opinion the patient is not relocating. The insurance denied the request for a second opinion.    Selden Noteboom Kirby Funk, NP

## 2020-04-07 ENCOUNTER — Ambulatory Visit
Admission: RE | Admit: 2020-04-07 | Discharge: 2020-04-07 | Disposition: A | Discharge status: Home / Self Care | Payer: Medicaid Other | Source: Ambulatory Visit | Admitting: Gastroenterology

## 2020-04-07 ENCOUNTER — Encounter: Payer: Self-pay | Admitting: Gastroenterology

## 2020-04-07 ENCOUNTER — Telehealth: Payer: Self-pay

## 2020-04-07 DIAGNOSIS — K219 Gastro-esophageal reflux disease without esophagitis: Secondary | ICD-10-CM

## 2020-04-07 DIAGNOSIS — K5900 Constipation, unspecified: Secondary | ICD-10-CM

## 2020-04-07 DIAGNOSIS — R109 Unspecified abdominal pain: Secondary | ICD-10-CM

## 2020-04-07 NOTE — Telephone Encounter (Signed)
Patient was seen for Telehome visit, on 04/08/2019, by GI PROVIDER NAME: Dr. Wilhemena Durie.     Writer is unable to schedule.  Patient will receive another call from the office to schedule 3 mt fuv.      Provider's recommendations were: Marland Kitchen Follow-up in 3 months with Dr. Sofie Hartigan, telephone visit            AVS provided to patient via mychart.

## 2020-04-07 NOTE — Discharge Instructions (Signed)
Continue high fiber diet with kiwis.   Contact the office if no improvement in symptoms, then will start Linzess  Take Famotidine 20mg  twice daily. If no improvement, increase to 40mg  twice daily  Follow-up in 3 months

## 2020-04-07 NOTE — Telephone Encounter (Signed)
Patient is scheduled   

## 2020-04-07 NOTE — Progress Notes (Signed)
Telephone consult due to [place]    04/07/20     Reason for visit: Abdominal pain and altered bowel habits    We had the pleasure of seeing your patient, Darlene Murphy, in the outpatient gastroenterology/hepatology clinic. As you know, she is a 30 y.o. female with a past medical history significant for headache and irregular menses who presents for follow-up of LUQ pain and altered bowel habits. Last visit was on 12/10/19. Patient follows with Dr. [name].     HPI: To recap her history she was initially referred to our service by thoracic surgery.  Since approximately 2019 the patient has struggled with symptoms including abdominal pain, constipation, diarrhea, nausea, reflux, dizziness and lightheadedness.  She has had a thorough work-up including a CT of the abdomen and pelvis (2019), EGD (last in 04/2019), colonoscopy (03/2018), UGISBFT(2019), right upper quadrant ultrasound 09/2017),  CT of the abdomen and pelvis 01/2018), HIDA scan with an of EF 68% and also reproducible abdominal pain with CCK (04/2018), cholecystectomy (04/2019) without resolution of symptoms, esophagram 08/2019) ( CT enterography (10/2019), nuclear medicine gastric emptying study (10/2019). She had blood work which was significant for an elevated iron to 204, transferrin saturation 68% (on a repeat glucose within normal limits) and a fecal calprotectin which is mildly elevated at 61. LFTs, zinc, vitamin D, folate, Vit B12, were WNL. Overall no study has revealed an objective cause for the patients symptoms.  Of note she has seen several GI providers in the past including Dr. Einar Gip and also Dr. Anastasio Champion and Dr. Junius Roads in the more distant past. She has briefly trialed PPI, as well as carafate, dicyclomine. She notes she was recommended to trial nortriptyline at one time but decided not to due to concerns that it was not explained clearly that the medication was also an antidepressant. She has noted flat affect and "feeling like a  zombie" with trial of an SSRI in the past. She has also trialed a low FODMAP diet which contributed to weight loss and worse subjective feeling of her diarrhea. She has had episodes of passing out. To date no medications have provided relief and she notes that she is unable to work due to her symptoms.    At her last visit, symptoms were persistent and continued to be bothersome.  Her most bothersome symptom is lightheadedness and episodes of dizziness where she feels as if she is going to pass out.  She feels that the symptoms are related to abdominal discomfort at times but can also happen without abdominal discomfort. The symptoms started around the same time that her abdominal symptoms started in 2019.  She reports that she has had an MRI of the head in the past which was unrevealing.  She has not seen a neurologist or been evaluated for tinnitus/migraines to her knowledge. Her predominant GI symptoms remain abdominal pain, bloating, diarrhea, constipation. She was recommended to decrease cholecystyramine to 1/2 packet daily and retrial IBGard 2 capsules with meals. Could consider Linzess if she continues to have constipation of cholestyramine.    Interval History  Glucose HBT was negative on 01/02/20. She stopped the cholestyramine. She was recommended to start [place], but reports she has been out of state and has not yet been able to start this medication. She reports does not like the idea of trying medications. She has started eating 1-2 kiwis over the last 2-3 weeks. She reports her bowel habits are more regular. She had one episode of diarrhea. She is trying to  increase fiber in her diet. She was having a BM every 1-2 days. She continues to not move her bowels for a few days, but this is improved. She denies current hematochezia or melena.  She continues to have intermittent abdominal pain. She continues to have increased gas and bloating. She has intermittent acid reflux. She was taking Famotidine 82m  daily without improvement. Her appetite fluctuates depending on her symptoms. Her weight continues to fluctuate by 5-10 lbs.  She has constant nausea, no vomiting or dysphagia. She takes SL zofran, but she has not taken this over the last 1.5 months. She continues to have weakness and lightheadedness. She reports having an episode of syncope after shoveling snow. No fevers, jaundice, icterus. She rarely takes NSAIDs.      Past medical history, allergies, and medications updated and reviewed.    Medications:  Current Outpatient Medications   Medication Sig    linaCLOtide (LINZESS) 72 mcg capsule Take 1 capsule (72 mcg total) by mouth daily    omeprazole (PRILOSEC) 40 mg capsule Take 1 capsule (40 mg total) by mouth daily    famotidine (PEPCID) 20 MG tablet Take 1 tablet (20 mg total) by mouth 2 times daily    cholestyramine (QUESTRAN) 4 g packet Take 1 packet by mouth daily (with dinner) (Patient not taking: Reported on 02/08/2020)        ROS:  Pertinent positives noted in HPI, other systems negative.    PE:  Not performed as visit is conducted via telephone.    Labs/Imaging:   Ref. Range 11/17/2019 11:04   Sodium Latest Ref Range: 133 - 145 mmol/L 142   Potassium Latest Ref Range: 3.3 - 5.1 mmol/L 4.2   Chloride Latest Ref Range: 96 - 108 mmol/L 105   CO2 Latest Ref Range: 20 - 28 mmol/L 24   Anion Gap Latest Ref Range: 7 - 16  13   UN Latest Ref Range: 6 - 20 mg/dL 12   Creatinine Latest Ref Range: 0.51 - 0.95 mg/dL 0.84   GFR,Black Latest Units: * 109   GFR,Caucasian Latest Units: * 94   Glucose Latest Ref Range: 60 - 99 mg/dL 78   Calcium Latest Ref Range: 8.8 - 10.2 mg/dL 9.4   Total Protein Latest Ref Range: 6.3 - 7.7 g/dL 7.1   Albumin Latest Ref Range: 3.5 - 5.2 g/dL 4.7   Prealbumin Latest Ref Range: 20 - 40 mg/dL 22   ALT Latest Ref Range: 0 - 35 U/L 24   AST Latest Ref Range: 0 - 35 U/L 22   Alk Phos Latest Ref Range: 35 - 105 U/L 47   Bilirubin,Total Latest Ref Range: 0.0 - 1.2 mg/dL 0.7   Iron Latest  Ref Range: 34 - 165 ug/dL 204 (H)   TIBC Latest Ref Range: 250 - 450 ug/dL 302   Transferrin Saturation Latest Ref Range: 15 - 50 % 68 (H)   Ferritin Latest Ref Range: 10 - 120 ng/mL 31   Folate Latest Ref Range: >=4.6 ng/mL >14.0   Vitamin B12 Latest Ref Range: 232 - 1,245 pg/mL 509   CRP Latest Ref Range: 0 - 8 mg/L <3   25-OH Vit Total Latest Ref Range: 30 - 60 ng/mL 55   Zinc Latest Ref Range: 60.0 - 120.0 ug/dL 108.0   WBC Latest Ref Range: 4.0 - 10.0 THOU/uL 4.8   RBC Latest Ref Range: 3.9 - 5.2 MIL/uL 4.3   Hemoglobin Latest Ref Range: 11.2 - 15.7 g/dL 13.7  Hematocrit Latest Ref Range: 34 - 45 % 39   MCV Latest Ref Range: 79 - 95 fL 90   MCH Latest Ref Range: 26 - 32 pg 32   MCHC Latest Ref Range: 32 - 36 g/dL 35   RDW Latest Ref Range: 11.7 - 14.4 % 11.4 (L)   Platelets Latest Ref Range: 160 - 370 THOU/uL 205   Neut # K/uL Latest Ref Range: 1.6 - 6.1 THOU/uL 2.2   Lymph # K/uL Latest Ref Range: 1.2 - 3.7 THOU/uL 2.0   Mono # K/uL Latest Ref Range: 0.2 - 0.9 THOU/uL 0.5   Eos # K/uL Latest Ref Range: 0.0 - 0.4 THOU/uL 0.0   Baso # K/uL Latest Ref Range: 0.0 - 0.1 THOU/uL 0.0   IMM Granulocytes # Latest Ref Range: 0.0 - 0.0 THOU/uL 0.0   Nucl RBC # K/uL Latest Ref Range: 0.0 - 0.0 THOU/uL 0.0   Seg Neut % Latest Units: % 46.0   Lymphocyte % Latest Units: % 42.0   Monocyte % Latest Units: % 10.6   Eosinophil % Latest Units: % 0.8   Basophil % Latest Units: % 0.4   IMM Granulocytes Latest Units: % 0.2   Nucl RBC % Latest Ref Range: 0.0 - 0.2 /100 WBC 0.0       Assessment:  30 y.o. female with abdominal pain and altered bowel habits. Workup reviewed in HPI. Glucose HBT was negative on 01/02/20. She stopped the cholestyramine. She was recommended to start [place], but reports she has been out of state and has not yet been able to start this medication. She started eating kiwis and notes mild improvement in her bowel habits, but she continues to be symptomatic. 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 She was wondering if her symptoms are related to mold exposure. We discussed there is no formal testing that can be completed by our office for this and recommend symptom management. Her symptoms are improved with following a high fiber diet and having 1-2 kiwis daily over the last 2-3 weeks. She would like to continue diet changes and monitor symptoms. If symptoms continue despite diet changes, she would then try Linzess. 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 She reports having uncontrolled acid reflux, recommend increasing H2 blocker.     PLAN  -Continue high fiber diet with 1-2 kiwis daily.   -Patient will contact the office if no improvement in symptoms, then will start Linzess 75mg daily.  -Take Famotidine 282mtwice daily. If no improvement, increase to 4080mwice daily.  - After trial of Linzess, if no improvement, then will start a TCA.     Follow-up in 3 months with Dr. MarPurvis Kiltselephone visit.     Consent was obtained from the patient to complete this telephone consult, including potential financial liability.    21+ minutes was spent reviewing the EMR and management of this patient.  Greater than 50% of the time was spent on the phone with the patient.     SteRaylene EvertsP-C

## 2020-05-26 ENCOUNTER — Emergency Department
Admission: EM | Admit: 2020-05-26 | Discharge: 2020-05-27 | Disposition: A | Discharge status: Home / Self Care | Payer: Medicaid - Out of State | Attending: Emergency Medicine | Admitting: Emergency Medicine

## 2020-05-26 ENCOUNTER — Other Ambulatory Visit: Payer: Self-pay

## 2020-05-26 DIAGNOSIS — R519 Headache, unspecified: Secondary | ICD-10-CM | POA: Diagnosis not present

## 2020-05-26 DIAGNOSIS — H539 Unspecified visual disturbance: Secondary | ICD-10-CM | POA: Insufficient documentation

## 2020-05-26 DIAGNOSIS — R200 Anesthesia of skin: Secondary | ICD-10-CM | POA: Insufficient documentation

## 2020-05-26 LAB — URINALYSIS, COMPLETE (UACMP) WITH MICROSCOPIC
Bacteria, UA: NONE SEEN
Bilirubin Urine: NEGATIVE
Glucose, UA: NEGATIVE mg/dL
Hgb urine dipstick: NEGATIVE
Ketones, ur: NEGATIVE mg/dL
Leukocytes,Ua: NEGATIVE
Nitrite: NEGATIVE
Protein, ur: NEGATIVE mg/dL
Specific Gravity, Urine: 1.001 — ABNORMAL LOW (ref 1.005–1.030)
Squamous Epithelial / HPF: NONE SEEN (ref 0–5)
pH: 7 (ref 5.0–8.0)

## 2020-05-26 LAB — COMPREHENSIVE METABOLIC PANEL
ALT: 24 U/L (ref 0–44)
AST: 24 U/L (ref 15–41)
Albumin: 4.8 g/dL (ref 3.5–5.0)
Alkaline Phosphatase: 40 U/L (ref 38–126)
Anion gap: 8 (ref 5–15)
BUN: 11 mg/dL (ref 6–20)
CO2: 25 mmol/L (ref 22–32)
Calcium: 9.2 mg/dL (ref 8.9–10.3)
Chloride: 106 mmol/L (ref 98–111)
Creatinine, Ser: 0.88 mg/dL (ref 0.44–1.00)
GFR, Estimated: >60 mL/min (ref >60)
Glucose, Bld: 110 mg/dL — ABNORMAL HIGH (ref 70–99)
Potassium: 3.8 mmol/L (ref 3.5–5.1)
Sodium: 139 mmol/L (ref 135–145)
Total Bilirubin: 0.7 mg/dL (ref 0.3–1.2)
Total Protein: 7.6 g/dL (ref 6.5–8.1)

## 2020-05-26 LAB — CBC
HCT: 38.6 % (ref 36.0–46.0)
Hemoglobin: 13.5 g/dL (ref 12.0–15.0)
MCH: 31 pg (ref 26.0–34.0)
MCHC: 35 g/dL (ref 30.0–36.0)
MCV: 88.7 fL (ref 80.0–100.0)
Platelets: 205 10*3/uL (ref 150–400)
RBC: 4.35 MIL/uL (ref 3.87–5.11)
RDW: 11.9 % (ref 11.5–15.5)
WBC: 4.9 10*3/uL (ref 4.0–10.5)
nRBC: 0 % (ref 0.0–0.2)

## 2020-05-26 LAB — POC URINE PREG, ED: Preg Test, Ur: NEGATIVE

## 2020-05-26 NOTE — ED Notes (Signed)
Symptoms discussed with Dr. Erma Heritage, no CT to be done at this time.

## 2020-05-26 NOTE — ED Triage Notes (Signed)
Pt states she had an episode about 1hr prior to arrival where vision became dark and had "half circle of lights" with eyes open or closed. Pt states she had this visual disturbances with eyes open or closed. Pt states vision has returned to normal now just has generalized weakness.

## 2020-05-27 ENCOUNTER — Emergency Department: Payer: Medicaid - Out of State

## 2020-05-27 ENCOUNTER — Encounter: Payer: Self-pay | Admitting: Emergency Medicine

## 2020-05-27 NOTE — ED Provider Notes (Signed)
[facility] Emergency Department Provider Note  ____________________________________________   Event Date/Time   First MD Initiated Contact with Patient 05/26/20 2316     (approximate)  I have reviewed the triage vital signs and the nursing notes.   HISTORY  Chief Complaint Blurred Vision    HPI Katelyn Turner is a 30 y.o. female who presents to the emergency department with complaints of binocular vision loss in the center of her vision that occurred around dinnertime tonight.  She states symptoms lasted for about 30 to 40 minutes and then resolved.  She describes it as loss of vision in the center of her vision like a "semicircle" but also seeing lines of color move throughout this area.  No flashers, floaters.  She does wear contacts.  Took her contacts out without any relief.  No eye pain, tearing, drainage.  She states now her vision feels back to normal.  She is now complaining of a right-sided headache.  States she has had a right-sided headache for months that she suspects is secondary to mold exposure in her apartment.  She denies known history of migraines and has not followed up with a neurologist.  She states she did have some numbness, tingling in both sides of her face and hands when this happened but thinks that may have been due to anxiety.  No numbness, weakness at this time.  No speech changes.  She is having some nausea and lightheadedness currently.  She has never had anything like this happen to her before.        History reviewed. No pertinent past medical history.  There are no problems to display for this patient.   Past Surgical History:  Procedure Laterality Date  . CHOLECYSTECTOMY      Prior to Admission medications   Not on File    Allergies Azithromycin  History reviewed. No pertinent family history.  Social History    Review of Systems Constitutional: No fever. Eyes: No visual changes. ENT: No sore  throat. Cardiovascular: Denies chest pain. Respiratory: Denies shortness of breath. Gastrointestinal: No nausea, vomiting, diarrhea. Genitourinary: Negative for dysuria. Musculoskeletal: Negative for back pain. Skin: Negative for rash. Neurological: Negative for focal weakness or numbness.  ____________________________________________   PHYSICAL EXAM:  VITAL SIGNS: ED Triage Vitals  Enc Vitals Group     BP 05/26/20 2007 130/76     Pulse Rate 05/26/20 2007 82     Resp 05/26/20 2007 20     Temp 05/26/20 2007 98.4 F (36.9 C)     Temp Source 05/26/20 2007 Oral     SpO2 05/26/20 2007 99 %     Weight 05/26/20 2008 100 lb (45.4 kg)     Height 05/26/20 2008 5\' 6"  (1.676 m)     Head Circumference --      Peak Flow --      Pain Score 05/26/20 2008 0     Pain Loc --      Pain Edu? --      Excl. in GC? --    CONSTITUTIONAL: Alert and oriented and responds appropriately to questions. Well-appearing; well-nourished HEAD: Normocephalic EYES: Conjunctivae clear, pupils approximately 4 mm bilaterally and equally reactive to light, extraocular movements intact, no hyphema or hypopyon, no subconjunctival hemorrhage, no tearing or drainage ENT: normal nose; moist mucous membranes NECK: Supple, normal ROM CARD: RRR; S1 and S2 appreciated; no murmurs, no clicks, no rubs, no gallops RESP: Normal chest excursion without splinting or tachypnea; breath sounds  clear and equal bilaterally; no wheezes, no rhonchi, no rales, no hypoxia or respiratory distress, speaking full sentences ABD/GI: Normal bowel sounds; non-distended; soft, non-tender, no rebound, no guarding, no peritoneal signs, no hepatosplenomegaly BACK: The back appears normal EXT: Normal ROM in all joints; no deformity noted, no edema; no cyanosis SKIN: Normal color for age and race; warm; no rash on exposed skin NEURO: Moves all extremities equally, normal sensation diffusely, cranial nerves II through XII intact, normal speech,  strength 5/5 in all 4 extremities, normal gait PSYCH: The patient's mood and manner are appropriate.  ____________________________________________   LABS (all labs ordered are listed, but only abnormal results are displayed)  Labs Reviewed  COMPREHENSIVE METABOLIC PANEL - Abnormal; Notable for the following components:      Result Value   Glucose, Bld 110 (*)    All other components within normal limits  URINALYSIS, COMPLETE (UACMP) WITH MICROSCOPIC - Abnormal; Notable for the following components:   Color, Urine COLORLESS (*)    APPearance CLEAR (*)    Specific Gravity, Urine 1.001 (*)    All other components within normal limits  CBC  POC URINE PREG, ED   ____________________________________________  EKG  none ____________________________________________  RADIOLOGY I, Ayla Dunigan, personally viewed and evaluated these images (plain radiographs) as part of my medical decision making, as well as reviewing the written report by the radiologist.  ED MD interpretation: CT head shows no acute abnormality.  Official radiology report(s): CT Head Wo Contrast  Result Date: 05/27/2020 CLINICAL DATA:  Vision disturbance EXAM: CT HEAD WITHOUT CONTRAST TECHNIQUE: Contiguous axial images were obtained from the base of the skull through the vertex without intravenous contrast. COMPARISON:  None. FINDINGS: Brain: There is no mass, hemorrhage or extra-axial collection. The size and configuration of the ventricles and extra-axial CSF spaces are normal. The brain parenchyma is normal, without acute or chronic infarction. Vascular: No abnormal hyperdensity of the major intracranial arteries or dural venous sinuses. No intracranial atherosclerosis. Skull: The visualized skull base, calvarium and extracranial soft tissues are normal. Sinuses/Orbits: No fluid levels or advanced mucosal thickening of the visualized paranasal sinuses. No mastoid or middle ear effusion. The orbits are normal. IMPRESSION:  Normal head CT. Electronically Signed   By: Deatra Robinson M.D.   On: 05/27/2020 00:55    ____________________________________________   PROCEDURES  Procedure(s) performed (including Critical Care):  Procedures  ____________________________________________   INITIAL IMPRESSION / ASSESSMENT AND PLAN / ED COURSE  As part of my medical decision making, I reviewed the following data within the electronic MEDICAL RECORD NUMBER Nursing notes reviewed and incorporated, Labs reviewed , Old chart reviewed and Notes from prior ED visits         Patient here with episode of central vision loss but seeing colors move through her central vision.  She describes it is binocular in nature.  This may have been an ocular migraine but will obtain CT imaging of her head.  She is currently neurologically intact this time reports her vision is back to baseline.  No complaints of eye pain.  She is quite young for TIA, CVA without risk factors.  Have recommended outpatient neurology follow-up if imaging is unremarkable.  She is comfortable with this plan.  She is complaining of a right-sided headache at this time but declines medications currently.  ED PROGRESS  Patient continues to be asymptomatic at this time with regards to neurologic changes, vision changes.  Normal visual acuity.  Head CT unremarkable.  Mother at bedside.  They are comfortable plan for discharge home with close outpatient follow-up with neurology.  Discussed return precautions.  At this time, I do not feel there is any life-threatening condition present. I have reviewed, interpreted and discussed all results (EKG, imaging, lab, urine as appropriate) and exam findings with patient/family. I have reviewed nursing notes and appropriate previous records.  I feel the patient is safe to be discharged home without further emergent workup and can continue workup as an outpatient as needed. Discussed usual and customary return precautions. Patient/family  verbalize understanding and are comfortable with this plan.  Outpatient follow-up has been provided as needed. All questions have been answered.  ____________________________________________   FINAL CLINICAL IMPRESSION(S) / ED DIAGNOSES  Final diagnoses:  Vision changes     ED Discharge Orders    None      *Please note:  Jamiya Nims was evaluated in Emergency Department on 05/27/2020 for the symptoms described in the history of present illness. She was evaluated in the context of the global COVID-19 pandemic, which necessitated consideration that the patient might be at risk for infection with the SARS-CoV-2 virus that causes COVID-19. Institutional protocols and algorithms that pertain to the evaluation of patients at risk for COVID-19 are in a state of rapid change based on information released by regulatory bodies including the CDC and federal and state organizations. These policies and algorithms were followed during the patient's care in the ED.  Some ED evaluations and interventions may be delayed as a result of limited staffing during and the pandemic.*   Note:  This document was prepared using Dragon voice recognition software and may include unintentional dictation errors.   Kasara Schomer, Layla Maw, DO 05/27/20 [phone]

## 2020-05-27 NOTE — ED Notes (Signed)
E-signature for discharge not available, paper copy signed and placed to be scanned into chart.

## 2020-07-14 ENCOUNTER — Ambulatory Visit: Payer: Medicaid Other | Admitting: Gastroenterology

## 2020-11-06 ENCOUNTER — Other Ambulatory Visit: Payer: Self-pay | Admitting: Family Medicine

## 2020-11-06 DIAGNOSIS — J328 Other chronic sinusitis: Secondary | ICD-10-CM

## 2020-12-01 ENCOUNTER — Inpatient Hospital Stay: Admission: RE | Admit: 2020-12-01 | Payer: PRIVATE HEALTH INSURANCE | Source: Ambulatory Visit

## 2020-12-01 ENCOUNTER — Other Ambulatory Visit: Payer: Self-pay | Admitting: Family Medicine

## 2020-12-01 DIAGNOSIS — J328 Other chronic sinusitis: Secondary | ICD-10-CM

## 2020-12-10 ENCOUNTER — Other Ambulatory Visit: Payer: Self-pay

## 2020-12-10 ENCOUNTER — Ambulatory Visit
Admission: RE | Admit: 2020-12-10 | Discharge: 2020-12-10 | Disposition: A | Discharge status: Home / Self Care | Payer: 59 | Source: Ambulatory Visit | Attending: Family Medicine | Admitting: Family Medicine

## 2020-12-10 DIAGNOSIS — J328 Other chronic sinusitis: Secondary | ICD-10-CM | POA: Diagnosis not present

## 2020-12-15 DIAGNOSIS — Z77120 Contact with and (suspected) exposure to mold (toxic): Secondary | ICD-10-CM | POA: Diagnosis not present

## 2020-12-15 DIAGNOSIS — K580 Irritable bowel syndrome with diarrhea: Secondary | ICD-10-CM | POA: Diagnosis not present

## 2020-12-15 DIAGNOSIS — J3089 Other allergic rhinitis: Secondary | ICD-10-CM | POA: Diagnosis not present

## 2020-12-15 DIAGNOSIS — R42 Dizziness and giddiness: Secondary | ICD-10-CM | POA: Diagnosis not present

## 2020-12-15 DIAGNOSIS — R519 Headache, unspecified: Secondary | ICD-10-CM | POA: Diagnosis not present

## 2020-12-16 ENCOUNTER — Other Ambulatory Visit: Payer: PRIVATE HEALTH INSURANCE

## 2020-12-18 DIAGNOSIS — H43813 Vitreous degeneration, bilateral: Secondary | ICD-10-CM | POA: Diagnosis not present

## 2021-01-26 DIAGNOSIS — K589 Irritable bowel syndrome without diarrhea: Secondary | ICD-10-CM | POA: Diagnosis not present

## 2021-01-26 DIAGNOSIS — K209 Esophagitis, unspecified without bleeding: Secondary | ICD-10-CM | POA: Diagnosis not present

## 2021-01-26 DIAGNOSIS — K9049 Malabsorption due to intolerance, not elsewhere classified: Secondary | ICD-10-CM | POA: Diagnosis not present

## 2021-01-26 DIAGNOSIS — G501 Atypical facial pain: Secondary | ICD-10-CM | POA: Diagnosis not present

## 2021-02-09 DIAGNOSIS — Z7689 Persons encountering health services in other specified circumstances: Secondary | ICD-10-CM | POA: Diagnosis not present

## 2021-04-06 DIAGNOSIS — H16223 Keratoconjunctivitis sicca, not specified as Sjogren's, bilateral: Secondary | ICD-10-CM | POA: Diagnosis not present

## 2021-04-06 DIAGNOSIS — H43813 Vitreous degeneration, bilateral: Secondary | ICD-10-CM | POA: Diagnosis not present

## 2021-08-27 ENCOUNTER — Telehealth: Payer: Self-pay | Admitting: Internal Medicine

## 2021-08-27 NOTE — Telephone Encounter (Signed)
L/M to update mailing address so we can sent out a letter for Dr plains retirement

## 2021-09-23 ENCOUNTER — Telehealth: Payer: Self-pay | Admitting: Internal Medicine

## 2021-09-23 NOTE — Telephone Encounter (Signed)
Done in error.

## 2021-12-25 ENCOUNTER — Ambulatory Visit: Payer: Medicaid Other | Admitting: Family Medicine

## 2021-12-25 ENCOUNTER — Other Ambulatory Visit: Payer: Self-pay

## 2021-12-25 VITALS — BP 101/62 | HR 91 | Wt 105.0 lb

## 2021-12-25 DIAGNOSIS — J069 Acute upper respiratory infection, unspecified: Secondary | ICD-10-CM

## 2021-12-25 NOTE — Progress Notes (Signed)
Office Visit    A/P:  1. URI, acute         Likely viral with some associated fatigue and fairly prominent cough which is likely due to postnasal drip, upper airway irritation, mucus accumulation, and the cyclical irritation that can occur. Flonase and 2 days of Afrin as well as supportive care for now.    Darlene Panda, MD       S:  CC: Cough    Reports she's been weak since last Wednesday. She then developed congestion followed by sore throat which devloved into an initial dry cough and now into thick cough. She's had two negative covid tests. She is struggling most notably with fatigue and the cough itself. No measured fever.      BP 101/62 (BP Location: Right arm)   Pulse 91   Wt 47.6 kg (105 lb)   SpO2 98%   BMI 16.95 kg/m   T99.62F  G: NAD  ENT: mild effusion bilaterally behind Tms w/o AOM, no TTP sinuses, normal oropharynx with scant erythema and cobblestoning.  CV: RRR  Pulm: CTA  Skin: no noted rashes

## 2022-01-06 ENCOUNTER — Other Ambulatory Visit: Payer: Self-pay | Admitting: Hematology and Oncology

## 2022-03-01 ENCOUNTER — Telehealth: Payer: Self-pay | Admitting: Internal Medicine

## 2022-03-01 NOTE — Telephone Encounter (Signed)
After Hours Phone Call     Date of call: 03/01/2022  Time call received from service:16:47  Time call returned:16:47    Darlene Murphy 07-04-1990 called reporting:  She got stung in the mouth 3 times by bees  She is a beekeeper  Mouth the swelling  Denies shortness of breath  Denies tongue swelling denies problems swallowing  No other concerning symptoms  Has an EpiPen      Impression     3 bee stings  She has had more reaction each time she is gotten stung over the past couple of years  She has no concerning signs of progression towards anaphylaxis      Disposition / Plan      Patient instructed to:  She will use a dose of cetirizine now  2 Benadryl at bedtime  Knows to use the EpiPen and call 9 1 if anything is progressive  Follow-up as needed  Instructed to call back if symptoms worsen, change or progress.      Author: Lars Pinks, MD as of 03/01/2022  at 8:10 PM

## 2022-03-02 ENCOUNTER — Other Ambulatory Visit: Payer: Self-pay

## 2022-03-02 ENCOUNTER — Ambulatory Visit: Payer: Medicaid Other | Admitting: Family Medicine

## 2022-03-02 ENCOUNTER — Telehealth: Payer: Self-pay | Admitting: Family Medicine

## 2022-03-02 ENCOUNTER — Encounter: Payer: Self-pay | Admitting: Family Medicine

## 2022-03-02 VITALS — BP 120/80 | HR 60 | Temp 99.1°F | Ht 66.5 in | Wt 105.0 lb

## 2022-03-02 DIAGNOSIS — T63481A Toxic effect of venom of other arthropod, accidental (unintentional), initial encounter: Secondary | ICD-10-CM

## 2022-03-02 MED ORDER — PREDNISONE 50 MG PO TABS *I*
50.0000 mg | ORAL_TABLET | Freq: Every day | ORAL | 0 refills | Status: DC
Start: 2022-03-02 — End: 2022-06-18

## 2022-03-02 MED ORDER — FAMOTIDINE 20 MG PO TABS *I*
20.0000 mg | ORAL_TABLET | Freq: Two times a day (BID) | ORAL | 0 refills | Status: DC
Start: 2022-03-02 — End: 2022-06-18

## 2022-03-02 NOTE — Progress Notes (Signed)
[facility]  [address]  [place], [place] [address]                  SUBJECTIVE:       Reason For Visit:   Chief Complaint   Patient presents with    Allergic Reaction     Pt got stung by 3 bees yesterday . She has swelling in her neck and the right side of her face is swollen. Pt took zyrtec Advil and benadryl.      Works at [employer] and works with bees. Stung on mouth in several spots yesterday. Initially had of perioral swelling as she was stung in the lips but has now noted more swelling down right side of face. No dysphagia, dysphonia, difficulty breathing or swallowing, no GI sxs or rashes at any point      BP 120/80   Pulse 60   Temp 37.3 C (99.1 F)   Ht 1.689 m (5' 6.5")   Wt 47.6 kg (105 lb)   SpO2 96%   BMI 16.69 kg/m    Body mass index is 16.69 kg/m.     GEN: Alert and oriented, well appearing  ENT: see media. Normal oropharynx. Able to open mouth fully. Swelling around lips and right side of mandible into parotid area as well as right side of neck.     A&P:     1. Local reaction to hymenoptera sting  famotidine (PEPCID) 20 mg tablet    predniSONE (DELTASONE) 50 mg tablet         Discussed trial of prednisone and use of H2 and H1 blockers. Would not recommend NSAIDs with prednisone due to theoretical bleeding risk though tylenol ok. Recheck if worsening.     Jilda Panda, MD  03/02/22

## 2022-03-02 NOTE — Telephone Encounter (Signed)
Patient called with update on she is doing today. Swelling is worse but she said she is doing much  better. She is not concerned about the swelling at all. She said she does not think she needs to come in. She will keep an eye on things and she will call if things change.

## 2022-03-02 NOTE — Telephone Encounter (Signed)
Called to check-in. VM left.

## 2022-03-16 ENCOUNTER — Encounter: Payer: Medicaid Other | Admitting: Family Medicine

## 2022-06-14 ENCOUNTER — Ambulatory Visit: Payer: Medicaid Other | Attending: Urgent Care | Admitting: Urgent Care

## 2022-06-14 ENCOUNTER — Encounter: Payer: Self-pay | Admitting: Urgent Care

## 2022-06-14 ENCOUNTER — Other Ambulatory Visit: Payer: Self-pay

## 2022-06-14 VITALS — BP 108/64 | HR 96 | Temp 97.9°F | Resp 16

## 2022-06-14 DIAGNOSIS — F331 Major depressive disorder, recurrent, moderate: Secondary | ICD-10-CM | POA: Insufficient documentation

## 2022-06-14 DIAGNOSIS — J309 Allergic rhinitis, unspecified: Secondary | ICD-10-CM | POA: Insufficient documentation

## 2022-06-14 DIAGNOSIS — K582 Mixed irritable bowel syndrome: Secondary | ICD-10-CM | POA: Insufficient documentation

## 2022-06-14 DIAGNOSIS — K589 Irritable bowel syndrome without diarrhea: Secondary | ICD-10-CM | POA: Insufficient documentation

## 2022-06-14 DIAGNOSIS — R42 Dizziness and giddiness: Secondary | ICD-10-CM | POA: Insufficient documentation

## 2022-06-14 DIAGNOSIS — K219 Gastro-esophageal reflux disease without esophagitis: Secondary | ICD-10-CM | POA: Insufficient documentation

## 2022-06-14 DIAGNOSIS — F3342 Major depressive disorder, recurrent, in full remission: Secondary | ICD-10-CM | POA: Insufficient documentation

## 2022-06-14 DIAGNOSIS — R519 Headache, unspecified: Secondary | ICD-10-CM | POA: Insufficient documentation

## 2022-06-14 MED ORDER — ONDANSETRON 8 MG PO TBDP *I*
8.0000 mg | ORAL_TABLET | Freq: Three times a day (TID) | ORAL | 0 refills | Status: DC | PRN
Start: 2022-06-14 — End: 2022-06-18

## 2022-06-14 MED ORDER — MECLIZINE HCL 25 MG PO CHEW *I*
25.0000 mg | CHEWABLE_TABLET | Freq: Three times a day (TID) | ORAL | 0 refills | Status: DC | PRN
Start: 2022-06-14 — End: 2022-06-18

## 2022-06-14 NOTE — Nursing Note (Signed)
Pt. Presents to Urgent Care with complaint of facial pain/pressure, headache, nausea, lightheadedness x 1 week. Pt. Reports taking zofran x 2.

## 2022-06-14 NOTE — UC Provider Note (Signed)
History     Chief Complaint   Patient presents with    Headache    Nausea    Lightheadedness (Dizziness)     Pt. Presents to Urgent Care with complaint of facial pain/pressure, headache, nausea, lightheadedness x 1 week. Pt. Reports taking zofran x 77.       32 year old female with a significant history of abdominal cramping, anxiety, GERD, headaches, anorexia, syncope, depression, fatigue, ANA positive, and smoking presents to the urgent care with dizziness.  Patient states that she has a chronic history of abdominal pain and nausea.  However, over the past week, she has noticed increasing lightheadedness, neck pain that is around the throat, headache, and abdominal pain.  She denies any fevers or chills.  She denies any exposure to any illnesses that she is aware of.  She does admit to having some diarrhea associated this, but has no blood in the stool.  She denies any urinary symptoms such as dysuria, urgency, or frequency.  There is also been no gross hematuria.  She denies any shortness of breath or chest pain.  Abdominal pain is in the lower aspect and does not radiate.  Of note, the patient has had an extensive workup regarding her chronic symptoms.  This has included head and neck CT, blood tests, stool tests, gastric emptying study, CT enterogram, and barium swallow.  All have revealed no obvious etiologies.      History provided by:  Patient  Language interpreter used: No    Headache  Associated symptoms: abdominal pain, diarrhea, dizziness, nausea and neck pain    Associated symptoms: no back pain, no congestion, no cough, no drainage, no ear pain, no fatigue, no fever, no myalgias, no neck stiffness, no numbness, no seizures, no sinus pressure, no sore throat, no vomiting and no weakness        Medical/Surgical/Family History     Past Medical History:   Diagnosis Date    Abdominal cramping     Anxiety     GERD (gastroesophageal reflux disease)     Headache     Loose stools     Loss of appetite      Nausea         Patient Active Problem List   Diagnosis Code    Syncope, unspecified syncope type R55    Acute upper respiratory infection J06.9    Allergic rhinitis J30.9    ANA positive R76.8    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    Fatigue R53.83    Gastroesophageal reflux disease K21.9    Irritable bowel syndrome K58.9    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    Major depressive disorder, recurrent, in full remission F33.42    Major depressive disorder, recurrent, moderate F33.1    Nausea R11.0    Sleep disorder, unspecified G47.9    Underweight R63.6            Past Surgical History:   Procedure Laterality Date    CHOLECYSTECTOMY      COLONOSCOPY      CYST REMOVAL      TONSILLECTOMY      UPPER GASTROINTESTINAL ENDOSCOPY       Family History   Problem Relation Age of Onset    Depression Mother     Arthritis Mother     Cancer Father     Heart Disease Father     Pancreatic Cancer Paternal Uncle     Crohn's disease Paternal cousin     Crohn's disease  Paternal Uncle     Colon cancer Neg Hx     Celiac disease Neg Hx           Social History     Tobacco Use    Smoking status: Every Day     Packs/day: 0.50     Years: 11.00     Additional pack years: 0.00     Total pack years: 5.50     Types: Cigarettes    Smokeless tobacco: Never   Substance Use Topics    Alcohol use: Not Currently    Drug use: No     Living Situation       Questions Responses    Patient lives with     Homeless     Caregiver for other family member     External Services     Employment Employed    Domestic Violence Risk                   Review of Systems   Review of Systems   Constitutional:  Negative for activity change, chills, diaphoresis, fatigue, fever and unexpected weight change.   HENT:  Negative for congestion, ear pain, postnasal drip, rhinorrhea, sinus pressure, sinus pain, sneezing, sore throat, trouble swallowing and voice change.    Eyes:  Negative for discharge, redness and visual disturbance.   Respiratory:  Negative for apnea, cough,  chest tightness, shortness of breath and wheezing.    Cardiovascular:  Negative for chest pain, palpitations and leg swelling.   Gastrointestinal:  Positive for abdominal pain, diarrhea and nausea. Negative for abdominal distention, anal bleeding, blood in stool, constipation and vomiting.   Genitourinary:  Negative for difficulty urinating, dysuria, flank pain, frequency, genital sores, hematuria, menstrual problem, pelvic pain, urgency, vaginal bleeding, vaginal discharge and vaginal pain.   Musculoskeletal:  Positive for neck pain. Negative for arthralgias, back pain, myalgias and neck stiffness.   Skin:  Negative for color change and rash.   Allergic/Immunologic: Negative for environmental allergies.   Neurological:  Positive for dizziness, light-headedness and headaches. Negative for tremors, seizures, syncope, facial asymmetry, speech difficulty, weakness and numbness.   Hematological:  Negative for adenopathy.       Physical Exam   Vitals     First Recorded BP: 108/64, Resp: 16, Temp: 36.6 C (97.9 F), Temp src: TEMPORAL Oxygen Therapy SpO2: 97 %, Heart Rate: 96, (06/14/22 1842)  .      Physical Exam  Vitals and nursing note reviewed.   Constitutional:       General: She is not in acute distress.     Appearance: Normal appearance. She is not ill-appearing, toxic-appearing or diaphoretic.   HENT:      Right Ear: Hearing, tympanic membrane, ear canal and external ear normal. No drainage, swelling or tenderness. No middle ear effusion. No foreign body. Tympanic membrane is not injected, erythematous, retracted or bulging. Tympanic membrane has normal mobility.      Left Ear: Hearing, tympanic membrane, ear canal and external ear normal. No drainage, swelling or tenderness.  No middle ear effusion. No foreign body. Tympanic membrane is not injected, erythematous, retracted or bulging. Tympanic membrane has normal mobility.      Nose: No congestion or rhinorrhea.      Mouth/Throat:      Mouth: Mucous membranes  are moist. No oral lesions.      Pharynx: No pharyngeal swelling, oropharyngeal exudate, posterior oropharyngeal erythema or uvula swelling.      Tonsils: No tonsillar  exudate or tonsillar abscesses.   Eyes:      General: Lids are normal. No scleral icterus.     Extraocular Movements: Extraocular movements intact.      Right eye: No nystagmus.      Left eye: No nystagmus.      Conjunctiva/sclera: Conjunctivae normal.      Right eye: Right conjunctiva is not injected.      Left eye: Left conjunctiva is not injected.      Pupils: Pupils are equal, round, and reactive to light.   Cardiovascular:      Rate and Rhythm: Normal rate and regular rhythm.      Pulses: Normal pulses.      Heart sounds: Normal heart sounds, S1 normal and S2 normal. No murmur heard.  Pulmonary:      Effort: Pulmonary effort is normal. No respiratory distress.      Breath sounds: No wheezing, rhonchi or rales.   Abdominal:      General: There is no distension.      Palpations: There is no mass.      Tenderness: There is abdominal tenderness (Diffuse abdominal tenderness). There is guarding. There is no right CVA tenderness, left CVA tenderness or rebound.      Hernia: No hernia is present.   Lymphadenopathy:      Head:      Right side of head: No submental, submandibular, tonsillar, preauricular, posterior auricular or occipital adenopathy.      Left side of head: No submental, submandibular, tonsillar, preauricular, posterior auricular or occipital adenopathy.      Cervical: No cervical adenopathy.      Right cervical: No superficial, deep or posterior cervical adenopathy.     Left cervical: No superficial, deep or posterior cervical adenopathy.   Skin:     Coloration: Skin is not jaundiced.      Findings: No rash.   Neurological:      General: No focal deficit present.      Mental Status: She is alert and oriented to person, place, and time.      Cranial Nerves: No cranial nerve deficit.      Motor: No weakness.      Coordination: Coordination  normal.      Gait: Gait normal.      Deep Tendon Reflexes: Reflexes normal.   Psychiatric:         Mood and Affect: Mood normal.         Behavior: Behavior normal.         Thought Content: Thought content normal.         Judgment: Judgment normal.        Medical Decision Making   Medical Decision Making  Assessment:      32 year old female with a significant history of abdominal cramping, anxiety, GERD, headaches, anorexia, syncope, depression, fatigue, ANA positive, and smoking presents to the urgent care with dizziness.  Patient states that she has a chronic history of abdominal pain and nausea.  However, over the past week, she has noticed increasing lightheadedness, neck pain that is around the throat, headache, and abdominal pain.  She denies any fevers or chills.  She denies any exposure to any illnesses that she is aware of.  She does admit to having some diarrhea associated this, but has no blood in the stool.  She denies any urinary symptoms such as dysuria, urgency, or frequency.  There is also been no gross hematuria.  She denies any shortness of breath  or chest pain.  Abdominal pain is in the lower aspect and does not radiate.  Vitals normal.  Physical examination reveals an adult female in no acute distress does not appear acutely ill.  She is a bit thin.  She does have diffuse abdominal tenderness with guarding.    Differential diagnosis:      Functional dyspepsia  Cyclic vomiting syndrome  IBS  Dehydration      Plan and Results:      Given the degree of tenderness, this urgent care is not equipped for necessary testing.  I did talk to the patient about reporting to the emergency room given the degree of tenderness, but she states that this is chronic.  Given that her vitals were normal and she does have chronic symptoms, further testing and imaging is not warranted.  We did have a long discussion and agreed that further follow-up and management is warranted under her primary care physician at this  time.    Encounter orders    ^  No orders were placed during this encounter.  Lab results   No results found for this or any previous visit (from the past 24 hour(s)).    Diagnosis and Disposition:     Patient will follow-up with her primary care physician within the week for further evaluation and management.  If she has worsening symptoms, she will report to the emergency room.      Final Diagnosis    ICD-10-CM ICD-9-CM   1. Dizziness  R42 780.4         Slaton Reaser Susy [place], MD          Author:  Maris Berger, MD

## 2022-06-18 ENCOUNTER — Encounter: Payer: Self-pay | Admitting: Family Medicine

## 2022-06-18 ENCOUNTER — Other Ambulatory Visit
Admission: RE | Admit: 2022-06-18 | Discharge: 2022-06-18 | Disposition: A | Discharge status: Home / Self Care | Payer: Medicaid Other | Source: Ambulatory Visit | Attending: Family Medicine | Admitting: Family Medicine

## 2022-06-18 ENCOUNTER — Ambulatory Visit: Payer: Medicaid Other | Admitting: Family Medicine

## 2022-06-18 ENCOUNTER — Other Ambulatory Visit: Payer: Self-pay

## 2022-06-18 VITALS — BP 100/70 | HR 90 | Ht 67.0 in | Wt 105.0 lb

## 2022-06-18 DIAGNOSIS — Z0189 Encounter for other specified special examinations: Secondary | ICD-10-CM | POA: Insufficient documentation

## 2022-06-18 DIAGNOSIS — K589 Irritable bowel syndrome without diarrhea: Secondary | ICD-10-CM

## 2022-06-18 DIAGNOSIS — R519 Headache, unspecified: Secondary | ICD-10-CM

## 2022-06-18 DIAGNOSIS — Z0000 Encounter for general adult medical examination without abnormal findings: Secondary | ICD-10-CM

## 2022-06-18 DIAGNOSIS — Z681 Body mass index (BMI) 19 or less, adult: Secondary | ICD-10-CM

## 2022-06-18 LAB — COMPREHENSIVE METABOLIC PANEL
ALT: 14 U/L (ref 0–35)
AST: 19 U/L (ref 0–35)
Albumin: 4.7 g/dL (ref 3.5–5.2)
Alk Phos: 53 U/L (ref 35–105)
Anion Gap: 11 (ref 7–16)
Bilirubin,Total: 0.3 mg/dL (ref 0.0–1.2)
CO2: 25 mmol/L (ref 20–28)
Calcium: 9.4 mg/dL (ref 8.8–10.2)
Chloride: 105 mmol/L (ref 96–108)
Creatinine: 0.8 mg/dL (ref 0.51–0.95)
Glucose: 75 mg/dL (ref 60–99)
Lab: 9 mg/dL (ref 6–20)
Potassium: 3.9 mmol/L (ref 3.3–5.1)
Sodium: 141 mmol/L (ref 133–145)
Total Protein: 7.3 g/dL (ref 6.3–7.7)
eGFR BY CREAT: 101 *

## 2022-06-18 LAB — CBC AND DIFFERENTIAL
Baso # K/uL: 0 10*3/uL (ref 0.0–0.2)
Eos # K/uL: 0 10*3/uL (ref 0.0–0.5)
Hematocrit: 41 % (ref 34–49)
Hemoglobin: 14 g/dL (ref 11.2–16.0)
IMM Granulocytes #: 0 10*3/uL (ref 0.0–0.0)
IMM Granulocytes: 0.3 %
Lymph # K/uL: 1.2 10*3/uL (ref 1.0–5.0)
MCV: 91 fL (ref 75–100)
Mono # K/uL: 0.3 10*3/uL (ref 0.1–1.0)
Neut # K/uL: 2 10*3/uL (ref 1.5–6.5)
Nucl RBC # K/uL: 0 10*3/uL (ref 0.0–0.0)
Nucl RBC %: 0 /100 WBC (ref 0.0–0.2)
Platelets: 214 10*3/uL (ref 150–450)
RBC: 4.6 MIL/uL (ref 4.0–5.5)
RDW: 12 % (ref 0.0–15.0)
Seg Neut %: 54.9 %
WBC: 3.6 10*3/uL (ref 3.5–11.0)

## 2022-06-18 LAB — VITAMIN B12: Vitamin B12: 317 pg/mL (ref 232–1245)

## 2022-06-18 LAB — TSH: TSH: 3.03 u[IU]/mL (ref 0.27–4.20)

## 2022-06-18 NOTE — Progress Notes (Signed)
[facility]  [address]  [place], [place] [address]      Darlene Murphy is a 32 y.o. female here for their annual physical    INTERVAL HISTORY     Chief Complaint   Patient presents with    Annual Exam     Pt has been  having a migraine for the past 2 weeks. Pt went to urgent care and they think she has something systemic going on and would like to have labs ran.     Went to urgent care to have ears evaluated as she was having some facial pain, dysequilibrium in context of headache. This has improved. She was given zofran which she uses occasionally.    Reports issues over the last four years with GI issues, some neurological issues. Had some interest in further investigation with functional medicine but was not able to continue there due to cost.    She's now generally better aside from the last week in which she had a fairly severe occipital headache which has since improved though not subsided. She's been working and eating well.  She does note an issue with intermittent constipation and diarrhea still as well as an abnormal period.     GENERAL SCREENING       Health Maintenance  Immunizations: flu done, covid not utd  PAP: obgyn  Colonoscopy: 2020   Mammogram:no early indication      Life Style  Regular exercise:Yes  Healthy diet:Yes  Alcohol use: No  Smoking: No    Safety  Use seatbelts: Yes  Domestic violence:No        Medications reviewed/reconciled at today's visit and marked as reviewed; taking medications as prescribed unless noted otherwise in Interval History  Family history was reviewed at today's visit and marked as reviewed    REVIEW OF SYSTEMS   Review of Systems   Constitutional:  Negative for chills, fever and weight loss.   HENT:  Negative for congestion, hearing loss and sore throat.    Eyes:  Negative for blurred vision and double vision.   Respiratory:  Negative for cough and shortness of breath.    Cardiovascular:  Negative for chest pain and palpitations.    Gastrointestinal:  Negative for blood in stool, constipation, heartburn and vomiting.   Genitourinary:  Negative for frequency and urgency.   Musculoskeletal:  Negative for falls.   Skin:  Negative for rash.   Neurological:  Negative for dizziness, tingling and weakness.   Psychiatric/Behavioral:  Negative for depression and substance abuse.        PHYSICAL EXAM     BP 100/70 (BP Location: Right arm)   Pulse 90   Ht 1.702 m (5\' 7" )   Wt 47.6 kg (105 lb)   SpO2 99%   BMI 16.45 kg/m   Body mass index is 16.45 kg/m.  Physical Exam  Vitals and nursing note reviewed.   Constitutional:       General: She is not in acute distress.     Appearance: Normal appearance.   HENT:      Head: Normocephalic and atraumatic.      Right Ear: Tympanic membrane normal.      Left Ear: Tympanic membrane normal.      Nose: Nose normal.      Mouth/Throat:      Mouth: Mucous membranes are moist.   Eyes:      Extraocular Movements: Extraocular movements intact.      Pupils: Pupils  are equal, round, and reactive to light.   Neck:      Comments: Normal thyroid exam  Cardiovascular:      Rate and Rhythm: Normal rate and regular rhythm.      Pulses: Normal pulses.      Heart sounds: Normal heart sounds. No murmur heard.  Pulmonary:      Effort: Pulmonary effort is normal. No respiratory distress.      Breath sounds: Normal breath sounds.   Abdominal:      General: Abdomen is flat.      Palpations: Abdomen is soft.   Musculoskeletal:         General: Normal range of motion.      Cervical back: Normal range of motion and neck supple.      Comments: Normal gait   Skin:     General: Skin is warm and dry.      Capillary Refill: Capillary refill takes less than 2 seconds.      Coloration: Skin is not jaundiced or pale.      Findings: No bruising.      Comments: No atypical skin lesions noted on exam of face, upper extremities, trunk.    Neurological:      Mental Status: She is alert.      Comments: No interval changes to neurologic status.  Alert and orient to person, place, time, situation. CN 2-12 grossly intact. 2+ patellar and brachial reflexes.    Psychiatric:         Mood and Affect: Mood normal.         Behavior: Behavior normal.         ASSESSMENT AND PLAN OF CARE   Nao Callicoat  was seen today for annual exam.      Screening  BP and Labs discussed  Immunizations discussed   Counseled on regular exercise and a healthy high fiber diet  Counseled regarding safety, seat belts, helmets, and risky activities.  Advised updated dental and eye exams  Skin CA awareness/prevention discussed  Colonoscopy at Age>45 reviewed /screening tests   Advised Smoke detectors, Government social research officer, [place] Nurse, adult for general adult medical examination without abnormal findings  Body mass index (BMI) less than 16.5  Overall up to date on health maintenance aside from immunizations. Reports utd pap, no records available. If due, will coordinate.      Irritable bowel syndrome, unspecified type  Labs pending. Reviewed SNRI, cholestyramine, among other treatments. Has cholestyramine at home per report. Offered trial of 1/2 dose daily to see if improving.    Headache  Likely with some MSK origin (?occipital neuralgia vs cervicogenic vs 2/2 trapezius overuse) - some home exercises provided. If has severe flare, can trial injection if amenable.      Jilda Panda, MD 06/18/2022 11:05 AM

## 2022-10-14 ENCOUNTER — Other Ambulatory Visit: Payer: Self-pay

## 2022-10-14 ENCOUNTER — Ambulatory Visit: Payer: Medicaid Other | Attending: Obstetrics and Gynecology | Admitting: Obstetrics and Gynecology

## 2022-10-14 VITALS — BP 98/60 | Wt 105.5 lb

## 2022-10-14 DIAGNOSIS — N760 Acute vaginitis: Secondary | ICD-10-CM | POA: Insufficient documentation

## 2022-10-14 MED ORDER — FLUCONAZOLE 150 MG PO TABS *I*
150.0000 mg | ORAL_TABLET | Freq: Once | ORAL | 0 refills | Status: AC
Start: 2022-10-14 — End: 2022-10-14

## 2022-10-14 MED ORDER — METRONIDAZOLE 0.75 % VA GEL *I*
1.0000 | Freq: Every evening | VAGINAL | 0 refills | Status: AC
Start: 2022-10-14 — End: 2022-10-19

## 2022-10-14 NOTE — Progress Notes (Signed)
[facility] Gynecology Problem Visit    Subjective:  Darlene Murphy is an 32 y.o.  patient who is here for evaluation of a vulvar/vaginal complaint    The patient complains of vaginal odor and increased normal appearing discharge  This problem has been going on for approximately a few weeks although boric acid allows for some relief for a couple days  Associated symptoms: denies internal vaginal irritation, bleeding, dysuria  Female partner    Medical History  Patient's medications, allergies, past medical, surgical, social and family histories were reviewed and updated as appropriate.    Objective:    Physical Exam  BP 98/60 (BP Location: Left arm, Patient Position: Sitting, Cuff Size: adult)   Wt 47.9 kg (105 lb 8 oz)   LMP 10/04/2022 (Within Days)     Well developed and well-nourished.  A&O x3, no acute distress, appropriate mood and affect.  Unlabored respiratory effort.  External genitalia are without abnormalities.  Vagina well rugated, pink, no lesions or redness. Creamy discharge within vault  Cervix is without discharge, lesions or friability.  Uterus:  midline, mobile, and non-tender.No CMT  Adnexa are without obvious masses or tenderness.    Assessment/Plan:    1. Acute vaginitis  - affirm ordered  - Exam consistent with BV. Metrogel ordered  - Diflucan ordered for after completion as patient admits to yeast infections after antibiotic use    Gevena Cotton, DO

## 2022-10-15 LAB — VAGINITIS SCREEN: DNA PROBE: Vaginitis Screen:DNA Probe: POSITIVE — AB

## 2022-11-05 ENCOUNTER — Other Ambulatory Visit: Payer: Self-pay

## 2022-11-05 ENCOUNTER — Telehealth: Payer: Self-pay

## 2022-11-05 ENCOUNTER — Encounter: Payer: Self-pay | Admitting: Family

## 2022-11-05 ENCOUNTER — Ambulatory Visit: Payer: Medicaid Other | Attending: Family | Admitting: Family

## 2022-11-05 VITALS — BP 96/64 | Temp 97.6°F | Wt 101.0 lb

## 2022-11-05 DIAGNOSIS — N760 Acute vaginitis: Secondary | ICD-10-CM | POA: Insufficient documentation

## 2022-11-05 MED ORDER — METRONIDAZOLE 0.75 % VA GEL *I*
1.0000 | Freq: Every evening | VAGINAL | 0 refills | Status: AC
Start: 2022-11-05 — End: 2022-11-10

## 2022-11-05 MED ORDER — FLUCONAZOLE 150 MG PO TABS *I*
150.0000 mg | ORAL_TABLET | ORAL | 0 refills | Status: AC
Start: 2022-11-05 — End: ?

## 2022-11-05 NOTE — Telephone Encounter (Signed)
Calls with recurring BV symptoms.  Was treated with Metrogel previously and wonders if there is something else that she can use.  Also felt that the Diflucan did not work well.  Asks for additional medications to be sent in. I suggested office visit.  Transferred to schedule.

## 2022-11-05 NOTE — Progress Notes (Signed)
[facility] Gynecology Problem Visit    Subjective:    Darlene Murphy is a 32 y.o. [id number] who presents with concern for recurrent bacterial vaginosis.    She endorses vaginal discharge, odor and discomfort.  Was last seen in the office on 10/14/2022 and was treated for BV with Metrogel and was offered diflucan.   Patient states after she completed this course of treatment, she felt as if symptoms returned. Took OTC Monistat 1 day without much improvement. She is currently sexually active with one female partner who also has recurrent BV. States partner is without insurance, so she is treatment BV with OTC Borcic Acid. Patient agreeable to STI screening for peace of mind.         Medical History  Patient's medications, allergies, past medical, surgical, social and family histories were reviewed and updated as appropriate.      Objective:    Physical Exam  BP 96/64 (BP Location: Right arm, Patient Position: Sitting, Cuff Size: adult)   Temp 36.4 C (97.6 F) (Temporal)   Wt 45.8 kg (101 lb)   LMP 10/04/2022 (Within Days)     Well developed and well-nourished.  A&O x3, no acute distress, appropriate mood and affect.  Unlabored respiratory effort.  External genitalia are without abnormalities.  Vagina well rugated, pink, no lesions or redness.  watery white vaginal discharge present   Cervix is without discharge, lesions or friability.  Uterus:  midline, mobile, and non-tender.  Adnexa are without obvious masses or tenderness.    Assessment/Plan:    1. Vaginitis    - Vaginitis screen: DNA probe (VAG); Future  - metroNIDAZOLE (METROGEL-VAGINAL) 0.75 % vaginal gel; Place 1 applicatorful vaginally nightly for 5 doses for Vaginosis caused by Bacteria.  Dispense: 70 g; Refill: 0  - Chlamydia plasmid DNA amplification; Future  - N. 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  Dispense: 2 tablet; Refill: 0    Physical exam and symptomatology consistent with BV - plan to treat with Metrogel  Fluconazole offered at patient request, stating prone to yeast vaginitis after antibiotic use   Will follow up via MyChart as needed

## 2022-11-06 LAB — CHLAMYDIA NAAT (PCR): Chlamydia NAAT (PCR): NEGATIVE

## 2022-11-06 LAB — TRICHOMONAS NAAT: Trichomonas NAAT: NEGATIVE

## 2022-11-06 LAB — VAGINITIS SCREEN: DNA PROBE: Vaginitis Screen:DNA Probe: 0

## 2022-11-06 LAB — N. GONORRHOEAE NAAT (PCR): N. gonorrhoeae NAAT (PCR): NEGATIVE

## 2022-12-13 ENCOUNTER — Telehealth: Payer: Self-pay | Admitting: Family Medicine

## 2022-12-13 NOTE — Telephone Encounter (Signed)
Patient called with concerns of a bee sting to her neck region. Each time she has had a bee sting, the reaction has gotten worse. Concerned with swelling in the neck and what issues that will cause. She was instructed to take benadryl and ice the area. She does not have an epi pen so she was instructed to go to the emergency room for any tongue swelling or difficulty breathing.

## 2023-04-08 IMAGING — CT CT MAXILLOFACIAL W/O CM
1 of 2 series · 15 of 30 positions shown, 19 images · non-contrast
Comparison: Head CT 05/27/2020

CLINICAL DATA: Chronic sinusitis.  Headache.  Mold exposure.

EXAM:
CT MAXILLOFACIAL WITHOUT CONTRAST
TECHNIQUE: Multidetector CT images of the paranasal sinuses were obtained using
the standard protocol without intravenous contrast.

[Series 6: (person_name) · axial · 0.36mm/px · z∈[-146,+12]mm · 15 of 178 slices shown, 19 images]
[im 10/178  brain]
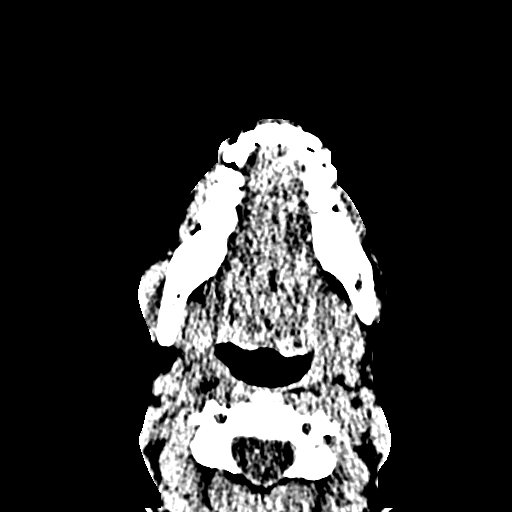
[im 10/178  bone]
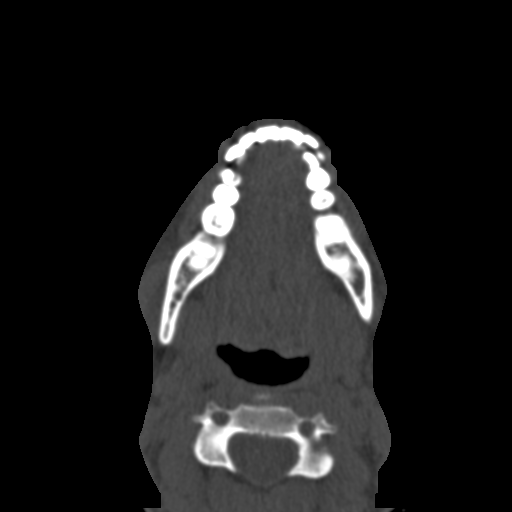
[im 19/178  bone]
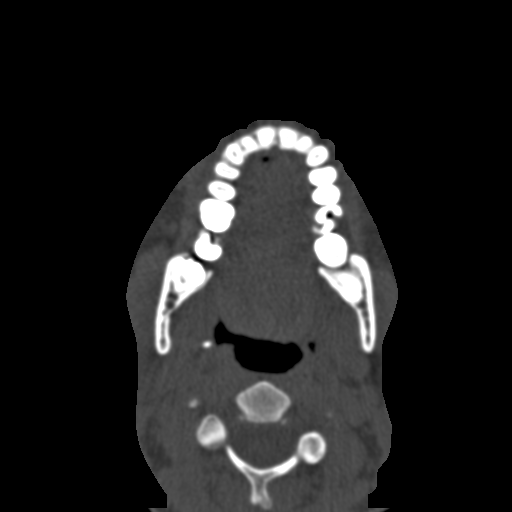
[im 38/178  bone]
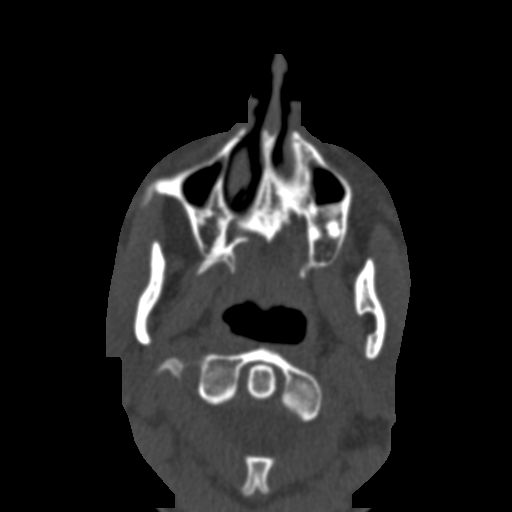
[im 47/178  bone]
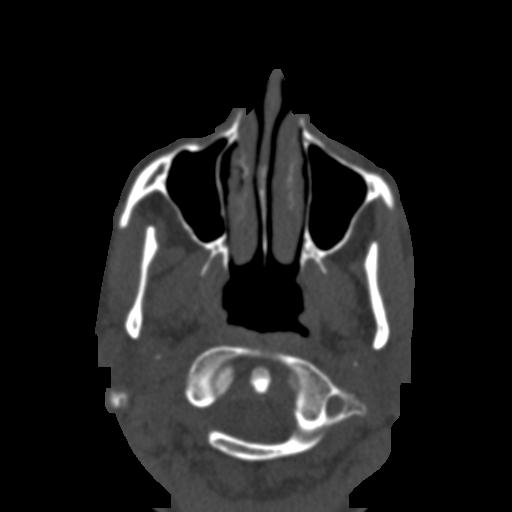
[im 56/178  brain]
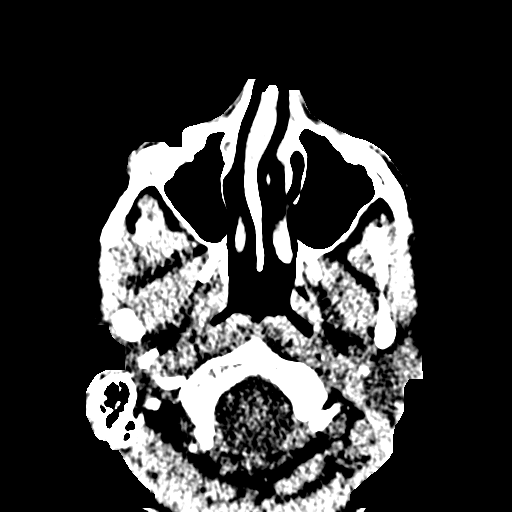
[im 56/178  bone]
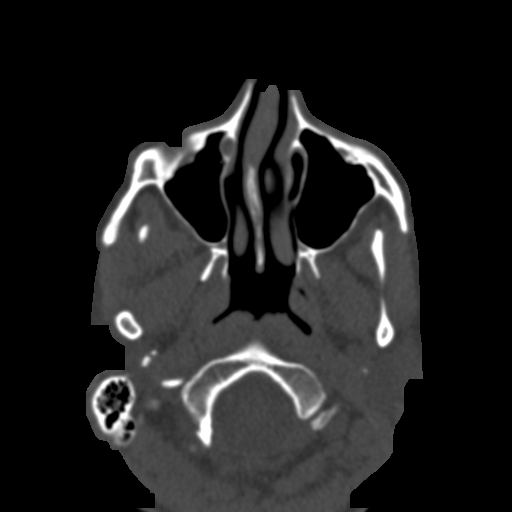
[im 66/178  bone]
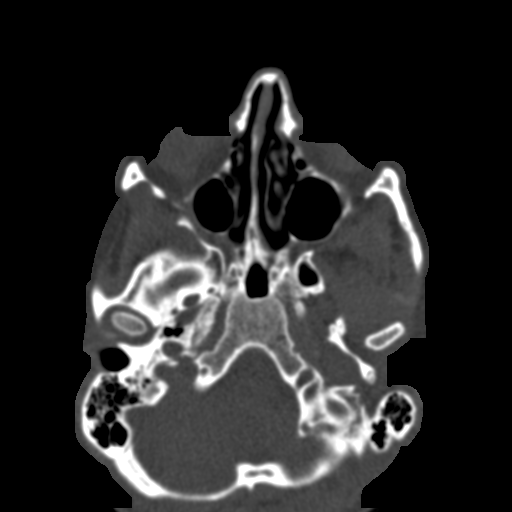
[im 75/178  bone]
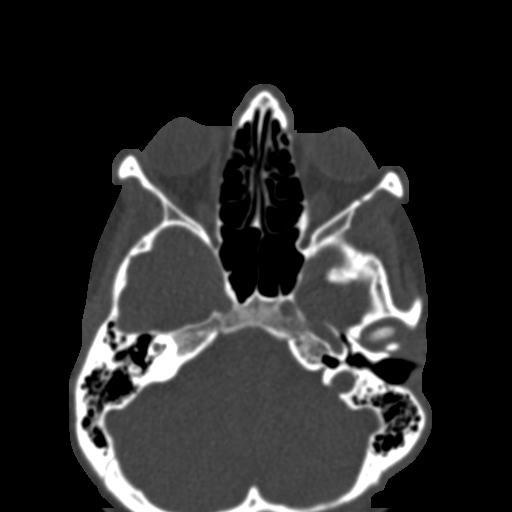
[im 94/178  bone]
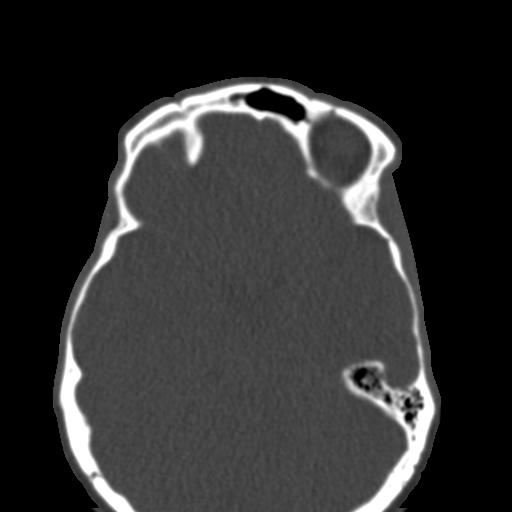
[im 103/178  brain]
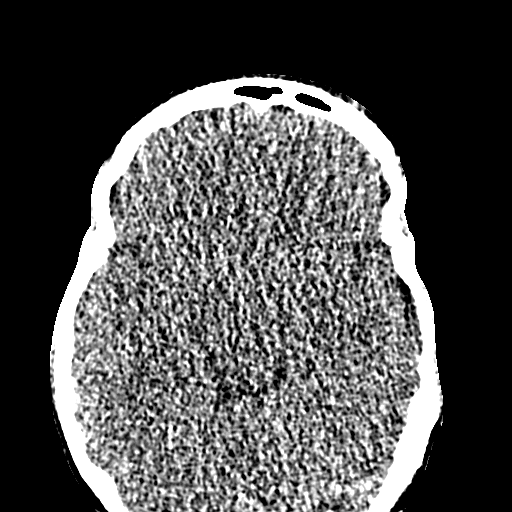
[im 103/178  bone]
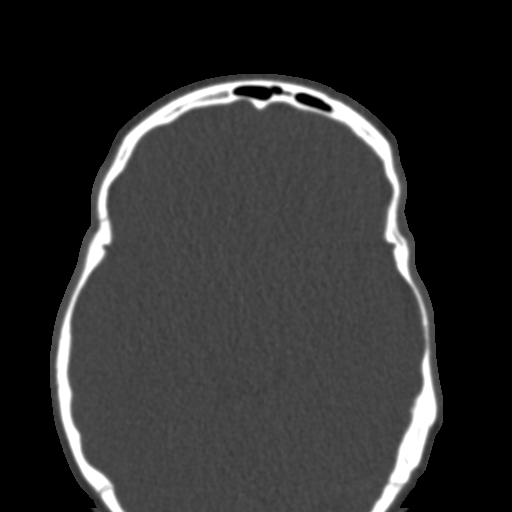
[im 112/178  bone]
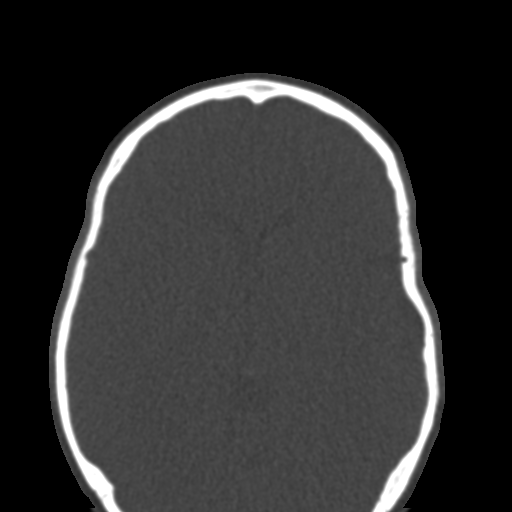
[im 122/178  bone]
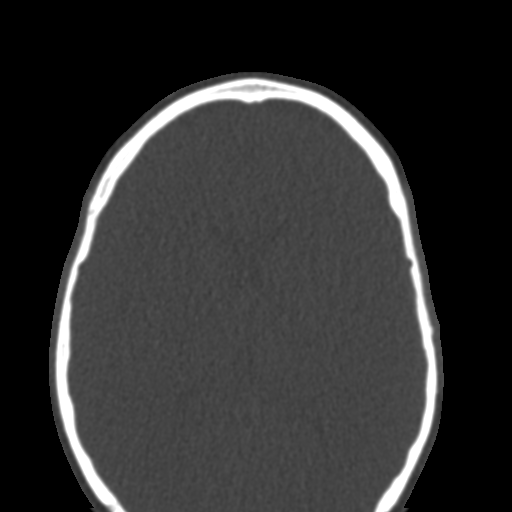
[im 131/178  bone]
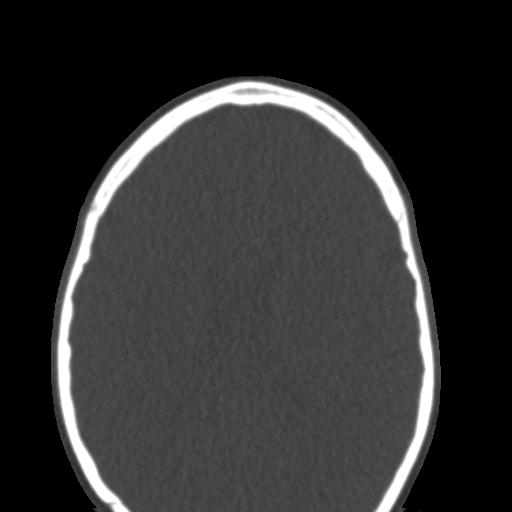
[im 150/178  brain]
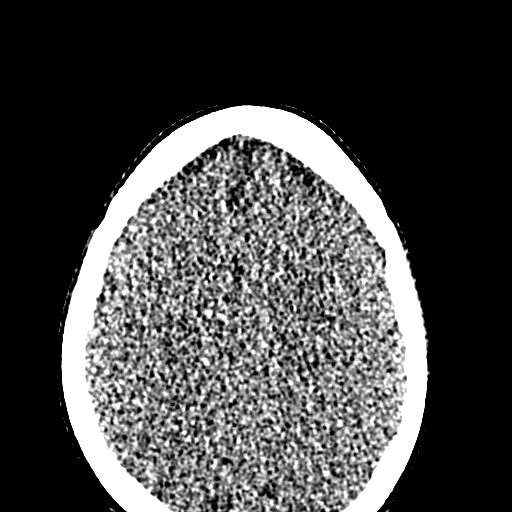
[im 150/178  bone]
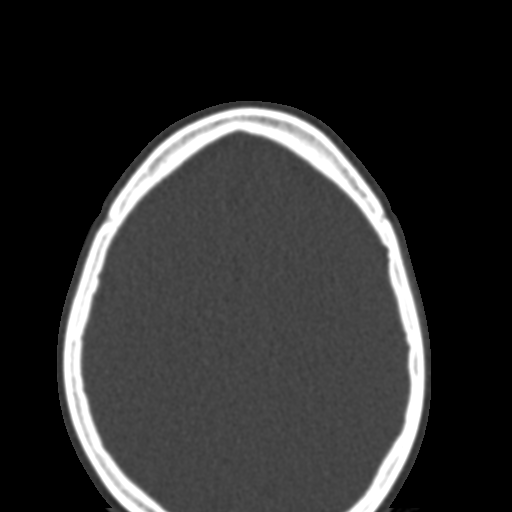
[im 159/178  bone]
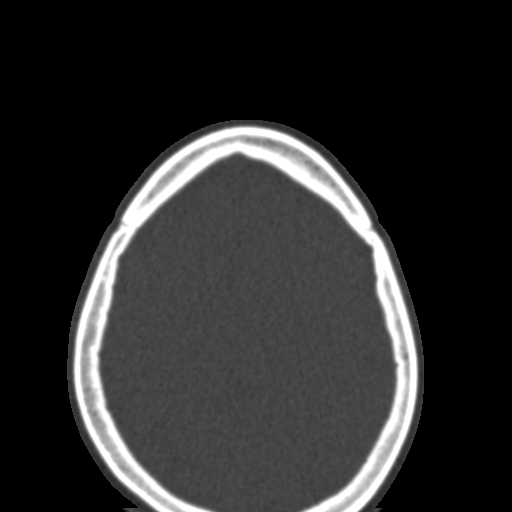
[im 168/178  bone]
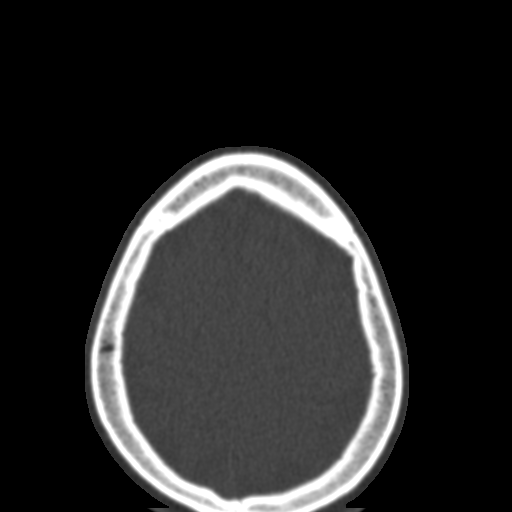

[15 of 30 positions shown; findings below may reference images not displayed]

FINDINGS: Paranasal sinuses:

Frontal: Normally aerated. Patent frontal sinus drainage pathways.

Ethmoid: Normally aerated.

Maxillary: Trace mucosal thickening in the alveolar recesses
bilaterally. No fluid.

Sphenoid: Normally aerated. Patent sphenoethmoidal recesses.

Right ostiomeatal unit: Patent.

Left ostiomeatal unit: Patent.

Nasal passages: Patent. Paradoxical rotation of the right middle
turbinate. Small left middle turbinate concha bullosa. 5 mm
rightward deviation of an intact nasal septum.

Anatomy: No pneumatization superior to anterior ethmoid notches.
Sellar sphenoid pneumatization pattern. No dehiscence of carotid or
optic canals. No onodi cell.

Other: Clear mastoid air cells and tympanic cavities. Unremarkable
appearance of the orbits and included portion of the brain.
IMPRESSION: 1. Trace bilateral maxillary sinus mucosal thickening. No fluid.
2. Rightward nasal septal deviation.

## 2023-12-09 ENCOUNTER — Encounter: Payer: Self-pay | Admitting: Internal Medicine

## 2024-01-02 ENCOUNTER — Other Ambulatory Visit: Payer: Self-pay

## 2024-01-02 ENCOUNTER — Emergency Department
Admission: EM | Admit: 2024-01-02 | Discharge: 2024-01-03 | Disposition: A | Discharge status: Home / Self Care | Source: Ambulatory Visit | Attending: Student in an Organized Health Care Education/Training Program | Admitting: Student in an Organized Health Care Education/Training Program

## 2024-01-02 DIAGNOSIS — F17210 Nicotine dependence, cigarettes, uncomplicated: Secondary | ICD-10-CM | POA: Insufficient documentation

## 2024-01-02 DIAGNOSIS — R109 Unspecified abdominal pain: Secondary | ICD-10-CM | POA: Insufficient documentation

## 2024-01-02 DIAGNOSIS — N939 Abnormal uterine and vaginal bleeding, unspecified: Secondary | ICD-10-CM | POA: Insufficient documentation

## 2024-01-02 DIAGNOSIS — R55 Syncope and collapse: Secondary | ICD-10-CM | POA: Insufficient documentation

## 2024-01-02 DIAGNOSIS — R1030 Lower abdominal pain, unspecified: Secondary | ICD-10-CM | POA: Insufficient documentation

## 2024-01-02 LAB — BLOOD BANK HOLD LAVENDER

## 2024-01-02 LAB — CBC AND DIFFERENTIAL
Baso # K/uL: 0 THOU/uL (ref 0.0–0.2)
Baso # K/uL: 0 THOU/uL (ref 0.0–0.2)
Eos # K/uL: 0 THOU/uL (ref 0.0–0.5)
Eos # K/uL: 0 THOU/uL (ref 0.0–0.5)
Hematocrit: 41 % (ref 34–49)
Hematocrit: 41 % (ref 34–49)
Hemoglobin: 14.1 g/dL (ref 11.2–16.0)
Hemoglobin: 14.1 g/dL (ref 11.2–16.0)
IMM Granulocytes #: 0 THOU/uL (ref 0–0)
IMM Granulocytes #: 0 THOU/uL (ref 0–0)
IMM Granulocytes: 0.2 %
IMM Granulocytes: 0.2 %
Lymph # K/uL: 1.2 THOU/uL (ref 1.0–5.0)
Lymph # K/uL: 1.2 THOU/uL (ref 1.0–5.0)
MCV: 90 fL (ref 75–100)
MCV: 90 fL (ref 75–100)
Mono # K/uL: 0.8 THOU/uL (ref 0.1–1.0)
Mono # K/uL: 0.8 THOU/uL (ref 0.1–1.0)
Neut # K/uL: 8.2 THOU/uL — ABNORMAL HIGH (ref 1.5–6.5)
Neut # K/uL: 8.2 THOU/uL — ABNORMAL HIGH (ref 1.5–6.5)
Nucl RBC # K/uL: 0 THOU/uL
Nucl RBC # K/uL: 0 THOU/uL
Nucl RBC %: 0 /100{WBCs} (ref 0.0–0.2)
Nucl RBC %: 0 /100{WBCs} (ref 0.0–0.2)
Platelets: 238 THOU/uL (ref 150–450)
Platelets: 238 THOU/uL (ref 150–450)
RBC: 4.6 MIL/uL (ref 4.0–5.5)
RBC: 4.6 MIL/uL (ref 4.0–5.5)
RDW: 11.7 % (ref 0.0–15.0)
RDW: 11.7 % (ref 0.0–15.0)
Seg Neut %: 80 %
Seg Neut %: 80 %
WBC: 10.3 THOU/uL (ref 3.5–11.0)
WBC: 10.3 THOU/uL (ref 3.5–11.0)

## 2024-01-02 LAB — PREGNANCY TEST, SERUM
Preg,Serum: NEGATIVE
Preg,Serum: NEGATIVE

## 2024-01-02 MED ORDER — DEXTROSE 5 % FLUSH FOR PUMPS *I*
0.0000 mL/h | INTRAVENOUS | Status: DC | PRN
Start: 2024-01-02 — End: 2024-03-02

## 2024-01-02 MED ORDER — SODIUM CHLORIDE 0.9 % FLUSH FOR PUMPS *I*
0.0000 mL/h | INTRAVENOUS | Status: DC | PRN
Start: 2024-01-02 — End: 2024-03-02

## 2024-01-02 NOTE — ED Triage Notes (Signed)
 Patient to ED via EMS from home with lower abdominal cramping. States she passed a couple blood clots vaginally. Unable to manage pain at home.   Prehospital medications given: No

## 2024-01-03 ENCOUNTER — Other Ambulatory Visit: Payer: Self-pay

## 2024-01-03 ENCOUNTER — Encounter: Payer: Self-pay | Admitting: Emergency Medicine

## 2024-01-03 ENCOUNTER — Emergency Department

## 2024-01-03 ENCOUNTER — Telehealth: Payer: Self-pay

## 2024-01-03 DIAGNOSIS — R1030 Lower abdominal pain, unspecified: Secondary | ICD-10-CM

## 2024-01-03 DIAGNOSIS — R935 Abnormal findings on diagnostic imaging of other abdominal regions, including retroperitoneum: Secondary | ICD-10-CM

## 2024-01-03 LAB — BASIC METABOLIC PANEL
Anion Gap: 10 (ref 7–16)
CO2: 24 mmol/L (ref 20–28)
Calcium: 9.3 mg/dL (ref 8.8–10.2)
Chloride: 105 mmol/L (ref 96–108)
Creatinine: 0.79 mg/dL (ref 0.51–0.95)
Glucose: 108 mg/dL — ABNORMAL HIGH (ref 60–99)
Lab: 13 mg/dL (ref 6–20)
Potassium: 4 mmol/L (ref 3.3–5.1)
Sodium: 139 mmol/L (ref 133–145)
eGFR BY CREAT: 101

## 2024-01-03 LAB — RUQ PANEL (ED ONLY)
ALT: 11 U/L (ref 0–35)
AST: 18 U/L (ref 0–35)
Albumin: 4.5 g/dL (ref 3.5–5.2)
Alk Phos: 53 U/L (ref 35–105)
Amylase: 82 U/L (ref 28–100)
Bilirubin,Direct: <0.2 mg/dL (ref 0.0–0.3)
Bilirubin,Total: 0.3 mg/dL (ref 0.0–1.2)
Lipase: 47 U/L (ref 13–60)
Total Protein: 7 g/dL (ref 6.3–7.7)

## 2024-01-03 LAB — HOLD EXTRA URINE

## 2024-01-03 LAB — LACTATE, VENOUS, WHOLE BLOOD: Lactate VEN,WB: 0.7 mmol/L (ref 0.5–2.2)

## 2024-01-03 LAB — LYME AB SCREEN: Lyme AB Screen: NEGATIVE

## 2024-01-03 LAB — HOLD BLUE

## 2024-01-03 LAB — CRP: CRP: <3 mg/L (ref 0–8)

## 2024-01-03 MED ORDER — IOHEXOL 350 MG/ML (OMNIPAQUE) IV SOLN *I*
1.0000 mL | Freq: Once | INTRAVENOUS | Status: AC
Start: 2024-01-03 — End: 2024-01-03
  Administered 2024-01-03: 71 mL via INTRAVENOUS

## 2024-01-03 MED ORDER — KETOROLAC TROMETHAMINE 30 MG/ML IJ SOLN *I*
15.0000 mg | Freq: Once | INTRAMUSCULAR | Status: DC
Start: 2024-01-03 — End: 2024-01-04
  Filled 2024-01-03: qty 1

## 2024-01-03 NOTE — Provider Consult (Signed)
 Vascular Surgery Consult H & PConsult Reason: Narrowing versus occlusion of SMVRequested By: EDHPI: Darlene Murphy is a 33 y.o. female with multiple years history of abdominal symptoms including cramping, constipation, diarrhea, reflux with various negative workup including esophagram, CT enterography, gastric emptying study.  She presents today due to a severe episode of lower abdominal pain at the onset of her menstrual cycle causing her to nearly pass out.  The episode lasted for 30 to 40 minutes.  During this episode, she felt chills, nausea, felt her vision was affected, and reports crying out on the floor unable to move.  She has similar episodes at the onset of her menstrual cycle which has occurred around 5 times, however never this severe.  She additionally has cramping related to her menstrual cycle, and currently is at her baseline 4 out of 10 cramping pain.  She has been able to eat since this episode without issue.  Her nausea has resolved.  She has not had any fevers at home.  Was otherwise in her normal state of health during this week, although did have diffuse joint pain, which sometimes occurs with her.  She works a physical job and is not sure if her job contributed to her joint pain as well.  She additionally reports that she had a tick bite in 2019 and is not sure if some of the symptoms are attributed to chronic Lyme.  No clotting history, REVIEW OF SYSTEMSROS pertinent items as listed in HPIMEDICAL HISTORYPast Medical History[1]SURGICAL HISTORYPast Surgical History[2]FAMILY HISTORYFamily History[3] SOCIAL HISTORY reports that she has been smoking cigarettes and cigarettes. She started smoking about 15 years ago. She has a 13.1 pack-year smoking history. She has never used smokeless tobacco. She reports current alcohol use. She reports that she does not currently use drugs after having used the following drugs: Marijuana.  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  ascorbic acid 250 mg tablet Take 1 tablet (250 mg total) by mouth daily.  Objective:  BP: (106-110)/(53-65) Temp:  [35.6 C (96.1 F)-36.4 C (97.5 F)] Temp src: Temporal (11/04 0354)Heart Rate:  [62-75] Resp:  [14] SpO2:  [96 %-98 %] Height:  [167.6 cm (5' 6)] Weight:  [47.6 kg (105 lb)] Intake/OutputNo intake/output data recorded.CMPNo results for input(s): NA, K, CL, CO2, CREAT, GLUCOSE, ALT, AST, APHOS, TB, DB, AMY, LIP, LAC in the last 168 hours.No components found with this basename: BUN, LABGLOM, CALCIUMCoagsNo results for input(s): APTT, INR, PTT in the last 168 hours.[facility] 11/03/252250 WBC 10.3  10.3 Hemoglobin 14.1  14.1 Hematocrit 41  41 Platelets 238  238 Imaging: 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 This appears new since 11/09/2019. No CT evidence of bowel ischemia. The celiac artery, superior mesenteric artery, and inferior mesenteric artery are patent.  END OF IMPRESSION Physical Exam:  General appearance: alert and no distressHEENT: extra ocular movement intactCV: regular rate and rhythmLungs: Non-labored respirationsAbd: Soft, non-tender, non-distended, well-healed Extremities:Peripheral pulses 2+Neuro:  normal without focal findings and mental  status, speech normal, alert and oriented x3Assessment: Darlene Murphy is a 33 y.o. female with multiple years history of abdominal symptoms including cramping, constipation, diarrhea, reflux with various negative workup including esophagram, CT enterography, gastric emptying study.  She presents today due to a severe episode of lower abdominal pain at the onset of her menstrual cycle causing her to nearly pass out, now resolved. She has finding of possible narrowing/occlusion at her SMV which is not seen on prior CT scans. A mesenteric duplex could better evaluate for flow through the SMV. At this time, her symptoms are not consistent with complete SMV occlusion as her severe pain episode has resolved and she has no tenderness to palpation throughout her abdomen. Plan: -- Recommend mesenteric duplex to better evaluate for possible SMV occlusion-- If there is SMV occlusion, recommend follow up by general surgery Discussed with Dr. Haywood page the vascular surgery resident on call for any questions.Jon Flatten, MDVascular Surgery11/05/2023 at 7:11 AM [1] Past Medical History:Diagnosis Date  Abdominal cramping   Anxiety   GERD (gastroesophageal reflux disease)   Headache   Loose stools   Loss of appetite   Nausea  [2] Past Surgical History:Procedure Laterality Date  CHOLECYSTECTOMY    COLONOSCOPY    CYST REMOVAL    TONSILLECTOMY    UPPER GASTROINTESTINAL ENDOSCOPY   [3] Family HistoryProblem Relation Name Age of Onset  Depression Mother    Arthritis Mother    Cancer Father    Heart Disease Father    Pancreatic Cancer Paternal Uncle great   Crohn's disease Paternal cousin    Crohn's disease Paternal Uncle great   Colon cancer Neg Hx    Celiac disease Neg Hx

## 2024-01-03 NOTE — Telephone Encounter (Signed)
 Darlene Murphy calls with questions about 2019/2020 office note. States that she was offered OCP at that time  but was not sure if there was any specific diagnosis .Are you able to access the [employer] note ? I am not. If so, can you send them to me or the patient ? Was in ED last night for abdominal pain.  States that she has this happends often right before getting her period . Imaging in the ED was WNL.This was discussed years ago and we had offered OCP at that time, which she had declined. She is looking for the notes from 2019/2020 to see if there was anything found at that time. I did suggest to patient that re evaluation should be done with [facility] to rule out any ovary concerns, etc.

## 2024-01-03 NOTE — ED Provider Progress Notes (Signed)
 ED Provider Progress NotePatient was a sign out from team pending vascular consult. Vascular recommend obtaining duplex to better visualize for possible SMV occlusion.  Ultrasound does not show any evidence of SMV occlusion.  Vascular does not feel symptoms are consistent with SMV occlusion.  Given no occlusion visualized no need for surgical consult.  Results were discussed with the patient.  Discharged home in stable conditions.This note was dictated using Conservation officer, historic buildings. A reasonable effort was made to proofread this note but there may be minor transcription/typographical errors.Labs Reviewed CBC AND DIFFERENTIAL - Abnormal; Notable for the following components:     Result Value  Neut # K/uL 8.2 (*)   All other components within normal limits CBC AND DIFFERENTIAL - Abnormal; Notable for the following components:  Neut # K/uL 8.2 (*)   All other components within normal limits BASIC METABOLIC PANEL - Abnormal; Notable for the following components:  Glucose 108 (*)   All other components within normal limits PREGNANCY TEST, SERUM PREGNANCY TEST, SERUM BLOOD BANK HOLD LAVENDER HOLD BLUE BASIC METABOLIC PANEL CRP LYME AB SCREEN CRP HOLD EXTRA URINE LACTATE, VENOUS, WHOLE BLOOD RUQ PANEL (ED ONLY) RUQ PANEL (ED ONLY) HOLD SST LYME AB SCREEN US  doppler abdomen limited Final Result  Per ordering provider, only the superior mesenteric vein was evaluated. The visualized portions of the superior mesenteric vein appear patent with normal direction of flow.  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 This appears new since 11/09/2019. No CT evidence of bowel ischemia. The celiac artery, superior mesenteric artery, and inferior mesenteric artery  are patent.  END OF IMPRESSION       Renny Kiel, PA, 01/03/2024, 10:35 AM Kiel Renny, PA11/04/25 1039

## 2024-01-03 NOTE — ED Provider Notes (Signed)
 History Chief Complaint Patient presents with  Abdominal Pain  Vaginal Bleeding 33 year old female with PMH of GERD presents the ED for evaluation of abdominal pain.Patient reports that she began her menses today.  It began with passage of what she felt was some large clots or tissue and she subsequently developed severe lower abdominal pain which she states caused her to have a near syncopal episode.  Following this she began to experience menstrual bleeding later on in the morning.  She reports the pain has subsided at this time however she notes that she has had similar episodes of this pain in the past on several different occasions over the course of several years.  She notes that she was diagnosed with Lyme multiple years ago but never received therapy as she was told that she was likely out of the window to receive therapy for this.  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 Constitutional:     Appearance: She is well-developed. HENT:    Head: Normocephalic and atraumatic.    Right Ear: External ear normal.    Left Ear: External ear normal.    Nose: Nose normal.    Mouth/Throat:    Pharynx: No oropharyngeal exudate. Eyes:    Conjunctiva/sclera: Conjunctivae normal.    Pupils: Pupils are equal, round, and reactive to light. Cardiovascular:    Rate and Rhythm: Normal rate and regular rhythm.    Heart sounds: Normal heart sounds. No murmur heard.   Comments: Radial pulse 2+ bilaterallyPulmonary:    Effort: Pulmonary effort is normal. No respiratory distress.    Breath sounds: Normal breath sounds. No wheezing or rales. Chest:    Chest wall: No tenderness. Abdominal:    General: There is no distension.    Palpations: Abdomen is soft.    Tenderness: There is no abdominal tenderness. Musculoskeletal:       General: No deformity. Normal range of motion.    Cervical back: Normal range of motion. Skin:   General: Skin is warm and dry.    Coloration: Skin is not pale.    Findings: No erythema or rash. Neurological:    Mental Status: She is alert and oriented to person, place, and time. Medical Decision Making Patient seen by me on:  11/3/2025Assessment:  33 year old female with PMH of GERD comes ED with abrupt onset severe low abdominal pain in the setting of her menstrual cycle resulting in a near syncopal episode today.Differential diagnosis:  Dysmenorrhea, vasovagal syncope, colitis, LymePlan:  Orders Placed This Encounter    CT abdomen and pelvis with contrast    CBC and  differential    Pregnancy Test, Serum    CBC and differential    Hold SST    Pregnancy Test, Serum    Blood bank hold lavender    Hold blue    BMP (SST)    C reactive protein     Lyme AB Screen    Basic metabolic panel    C reactive protein    Lyme AB Screen    Hold extra urine    Lactate, venous, whole blood    Insert peripheral IV    Insert peripheral IVMedicationssodium chloride 0.9 % FLUSH REQUIRED IF PATIENT HAS IV (has no administration in time range)dextrose  5 % FLUSH REQUIRED IF PATIENT HAS IV (has no administration in time range)sodium chloride  0.9 % FLUSH REQUIRED IF PATIENT HAS IV (has no administration in time range)dextrose  5 % FLUSH REQUIRED IF PATIENT HAS IV (has no administration in time range)ketorolac  (TORADOL ) 30 mg/mL injection 15 mg (15 mg Intravenous Not Given 01/03/24 0105)iohexol  (OMNIPAQUE ) 350 MG/ML injection 1-150 mL (71 mLs Intravenous Given 01/03/24 0502)ED Course and Disposition:  Labs okay.  CT A/P as below.  Lactate ordered, lactate not elevated. Vascular surgery consulted.  Patient signed out to morning team pending vascular consult.Labs ReviewedCBC AND DIFFERENTIAL - Abnormal; Notable for the following components:   Neut # K/uL                   8.2 (*)           All other components within normal limitsCBC AND DIFFERENTIAL - Abnormal; Notable for the following components:   Neut # K/uL                   8.2 (*)           All other components within normal limitsBASIC METABOLIC PANEL - Abnormal; Notable for the following components:   Glucose                       108 (*)           All other components within normal limitsPREGNANCY TEST, SERUMPREGNANCY TEST, SERUMBLOOD BANK HOLD LAVENDERHOLD BLUEBASIC METABOLIC PANELCRPLYME AB SCREENCRPHOLD EXTRA URINELACTATE, VENOUS, WHOLE BLOODHOLD SSTLYME AB SCREENCT abdomen and pelvis with contrast Final Result    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 This appears new since 11/09/2019. No CT evidence of bowel ischemia. The celiac artery, superior mesenteric  artery, and inferior mesenteric artery   are patent.    END OF IMPRESSION      Aida Gentry, PA  [1] Past Medical History:Diagnosis Date  Abdominal cramping   Anxiety   GERD (gastroesophageal reflux disease)   Headache   Loose stools   Loss of appetite   Nausea  [2] Past Surgical History:Procedure Laterality Date  CHOLECYSTECTOMY    COLONOSCOPY    CYST REMOVAL    TONSILLECTOMY    UPPER GASTROINTESTINAL ENDOSCOPY   [3] Social HistoryTobacco Use  Smoking status: Some Days   Current packs/day: 0.50   Average packs/day: 0.9 packs/day for 15.1 years (13.1 ttl pk-yrs)   Types: Cigarettes   Start date: 07/19/2008  Smokeless tobacco: Never  Tobacco comments:   Occasional smoking The data in the grid above may be an average based on historical data and used for Lung Cancer Screening Eligibility.  If you find it inaccurate you can delete it and take a more accurate history. Substance Use Topics  Alcohol  use: Yes   Comment: occ.  Drug use: Not Currently   Types: Marijuana   Comment: as a teen  Loreli Durand, PA11/04/25 9364

## 2024-01-03 NOTE — Discharge Instructions (Addendum)
 You were seen in the Emergency Department today for evaluation of abdominal pain. Pelvic pain. Based on our evaluation it appears that you are stable for discharge.  Laboratory blood work and imaging results indicate your laboratory work obtained in the emergency department today is reassuring.  Your initial CT scan showed signs of concern for an occlusion of your mesenteric vein.  The ultrasound obtained does not show any evidence of this.  Your presentation today is not consistent with an occlusion and vascular does not feel any further workup indicated at this time.  Continue to follow-up with your GYN for your pelvic discomfort.  There is no signs of any abnormalities of your pelvis on your CT scan.It is important to obtain follow up with your primary care physician whenever you have been evaluated in the Emergency Department. Please call your doctor's office and schedule an appointment. Return to the Emergency Department if your symptoms persist or worsen - especially if you experience chest pain, trouble breathing. Please return if you have any concerns and are unable to contact your PCP. Thank you for allowing me to care for you today. Hope that you feel better soon.

## 2024-01-04 NOTE — Telephone Encounter (Signed)
 Reviewed notes Diagnosis of dysmenorrhea -abdominal bloating, pain, diarrhea, irregular bleeding, premenstrual pain and weight loss. Many GI issues at that time and was seeing GI as wellWe prescribed OCP however was never started. Follow up as needed. Patient should follow up in office for her pain and most recent ED visit.
